# Patient Record
Sex: Male | Born: 1980
Health system: Southern US, Community
[De-identification: ages and names within clinical notes are randomized; demographics above are authoritative.]

## PROBLEM LIST (undated history)

## (undated) DIAGNOSIS — I499 Cardiac arrhythmia, unspecified: Secondary | ICD-10-CM

## (undated) DIAGNOSIS — Z9089 Acquired absence of other organs: Secondary | ICD-10-CM

## (undated) DIAGNOSIS — I2699 Other pulmonary embolism without acute cor pulmonale: Secondary | ICD-10-CM

## (undated) DIAGNOSIS — S82899A Other fracture of unspecified lower leg, initial encounter for closed fracture: Secondary | ICD-10-CM

## (undated) HISTORY — DX: Other pulmonary embolism without acute cor pulmonale: I26.99

## (undated) HISTORY — PX: SHOULDER SURGERY: SHX246

## (undated) HISTORY — DX: Acquired absence of other organs: Z90.89

## (undated) HISTORY — DX: Cardiac arrhythmia, unspecified: I49.9

## (undated) HISTORY — DX: Other fracture of unspecified lower leg, initial encounter for closed fracture: S82.899A

---

## 2008-05-08 DIAGNOSIS — J069 Acute upper respiratory infection, unspecified: Secondary | ICD-10-CM | POA: Insufficient documentation

## 2008-05-11 ENCOUNTER — Ambulatory Visit: Payer: Self-pay | Admitting: Family Medicine

## 2008-05-11 DIAGNOSIS — N6019 Diffuse cystic mastopathy of unspecified breast: Secondary | ICD-10-CM | POA: Insufficient documentation

## 2008-05-11 DIAGNOSIS — M25519 Pain in unspecified shoulder: Secondary | ICD-10-CM | POA: Insufficient documentation

## 2009-11-08 ENCOUNTER — Ambulatory Visit: Payer: Self-pay | Admitting: Family Medicine
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
## 2011-05-14 ENCOUNTER — Telehealth: Payer: Self-pay | Admitting: Family Medicine

## 2011-05-14 NOTE — Telephone Encounter (Signed)
Okay per dr Tawanna Cooler

## 2011-05-14 NOTE — Telephone Encounter (Signed)
Pt would like to have chickenpox titer to see if he's immune.

## 2011-05-15 NOTE — Telephone Encounter (Signed)
lmom for pt to call back

## 2011-05-15 NOTE — Telephone Encounter (Signed)
Pt must check sch and callback

## 2011-06-11 ENCOUNTER — Ambulatory Visit (INDEPENDENT_AMBULATORY_CARE_PROVIDER_SITE_OTHER): Payer: 59 | Admitting: Family Medicine

## 2011-06-11 ENCOUNTER — Encounter: Payer: Self-pay | Admitting: Family Medicine

## 2011-06-11 VITALS — BP 120/80 | Temp 97.8°F | Ht 73.0 in | Wt 127.0 lb

## 2011-06-11 DIAGNOSIS — Z299 Encounter for prophylactic measures, unspecified: Secondary | ICD-10-CM

## 2011-06-11 DIAGNOSIS — Z23 Encounter for immunization: Secondary | ICD-10-CM

## 2011-06-12 NOTE — Patient Instructions (Signed)
Tylenol as needed for fever.  Clear liquids until diarrhea completely stops.  Return p.r.n.

## 2011-06-12 NOTE — Progress Notes (Signed)
  Subjective:    Patient ID: William Baldwin, male    DOB: 1981/02/24, 30 y.o.   MRN: [id number]  HPI  Taj  Is a 30 year old male, who comes in for evaluation of abdominal cramps and diarrhea for 3 days.  The first two days.  He had fever and chills, fever, now gone.  No vomiting.  Review of systems otherwise negative  No foreign travel    Review of Systems General and GI review of systems otherwise negative    Objective:   Physical Exam  Well-developed well-nourished, male in no acute distress.  Examination of the abdomen shows the abdomen is soft.  The bowel sounds are normal.  There is no tenderness.  No masses.  No rebound      Assessment & Plan:  Viral gastroenteritis.  Plan treat symptomatically with Tylenol liquids

## 2014-11-09 ENCOUNTER — Emergency Department (HOSPITAL_COMMUNITY): Payer: 59

## 2014-11-09 ENCOUNTER — Emergency Department (HOSPITAL_COMMUNITY): Admission: EM | Admit: 2014-11-09 | Discharge: 2014-11-09 | Discharge status: Absent without leave | Payer: Self-pay

## 2014-11-09 ENCOUNTER — Emergency Department (HOSPITAL_COMMUNITY)
Admission: EM | Admit: 2014-11-09 | Discharge: 2014-11-10 | Disposition: A | Discharge status: Home / Self Care | Payer: 59 | Attending: Emergency Medicine | Admitting: Emergency Medicine

## 2014-11-09 ENCOUNTER — Encounter (HOSPITAL_COMMUNITY): Payer: Self-pay | Admitting: *Deleted

## 2014-11-09 DIAGNOSIS — N200 Calculus of kidney: Secondary | ICD-10-CM

## 2014-11-09 DIAGNOSIS — R109 Unspecified abdominal pain: Secondary | ICD-10-CM

## 2014-11-09 DIAGNOSIS — Z8781 Personal history of (healed) traumatic fracture: Secondary | ICD-10-CM | POA: Diagnosis not present

## 2014-11-09 LAB — URINALYSIS, ROUTINE W REFLEX MICROSCOPIC
BILIRUBIN URINE: NEGATIVE
Glucose, UA: NEGATIVE mg/dL
Ketones, ur: 15 mg/dL — AB
Leukocytes, UA: NEGATIVE
NITRITE: NEGATIVE
PROTEIN: NEGATIVE mg/dL
SPECIFIC GRAVITY, URINE: 1.021 (ref 1.005–1.030)
Urobilinogen, UA: 1 mg/dL (ref 0.0–1.0)
pH: 7.5 (ref 5.0–8.0)

## 2014-11-09 LAB — URINE MICROSCOPIC-ADD ON

## 2014-11-09 LAB — BASIC METABOLIC PANEL
ANION GAP: 9 (ref 5–15)
BUN: 15 mg/dL (ref 6–23)
CO2: 24 mmol/L (ref 19–32)
CREATININE: 1.02 mg/dL (ref 0.50–1.35)
Calcium: 9.1 mg/dL (ref 8.4–10.5)
Chloride: 106 mmol/L (ref 96–112)
Glucose, Bld: 104 mg/dL — ABNORMAL HIGH (ref 70–99)
POTASSIUM: 3.1 mmol/L — AB (ref 3.5–5.1)
Sodium: 139 mmol/L (ref 135–145)

## 2014-11-09 LAB — CBC
HEMATOCRIT: 43.2 % (ref 39.0–52.0)
Hemoglobin: 14.6 g/dL (ref 13.0–17.0)
MCH: 29.3 pg (ref 26.0–34.0)
MCHC: 33.8 g/dL (ref 30.0–36.0)
MCV: 86.7 fL (ref 78.0–100.0)
PLATELETS: 184 10*3/uL (ref 150–400)
RBC: 4.98 MIL/uL (ref 4.22–5.81)
RDW: 13.2 % (ref 11.5–15.5)
WBC: 8.7 10*3/uL (ref 4.0–10.5)

## 2014-11-09 MED ORDER — KETOROLAC TROMETHAMINE 30 MG/ML IJ SOLN
30.0000 mg | Freq: Once | INTRAMUSCULAR | Status: AC
  Administered 2014-11-09: 30 mg via INTRAVENOUS
  Filled 2014-11-09: qty 1

## 2014-11-09 MED ORDER — FENTANYL CITRATE 0.05 MG/ML IJ SOLN
50.0000 ug | Freq: Once | INTRAMUSCULAR | Status: AC
  Administered 2014-11-09: 50 ug via NASAL
  Filled 2014-11-09: qty 2

## 2014-11-09 MED ORDER — HYDROMORPHONE HCL 1 MG/ML IJ SOLN
1.0000 mg | Freq: Once | INTRAMUSCULAR | Status: AC
  Administered 2014-11-09: 1 mg via INTRAVENOUS
  Filled 2014-11-09: qty 1

## 2014-11-09 NOTE — ED Notes (Addendum)
Pt reports severe R flank pain which started today.  Pt reports the severity of the pain comes in waves.  Pt also c/o nausea

## 2014-11-09 NOTE — ED Notes (Signed)
Bed: WA06 Expected date:  Expected time:  Means of arrival:  Comments: No monitor, no bed

## 2014-11-10 ENCOUNTER — Encounter (HOSPITAL_COMMUNITY): Payer: Self-pay | Admitting: *Deleted

## 2014-11-10 MED ORDER — ONDANSETRON 4 MG PO TBDP: ORAL_TABLET | ORAL | Status: DC

## 2014-11-10 MED ORDER — HYDROCODONE-ACETAMINOPHEN 5-325 MG PO TABS: 1.0000 | ORAL_TABLET | Freq: Four times a day (QID) | ORAL | Status: DC | PRN

## 2014-11-10 MED ORDER — HYDROMORPHONE HCL 1 MG/ML IJ SOLN
1.0000 mg | Freq: Once | INTRAMUSCULAR | Status: AC
  Administered 2014-11-10: 1 mg via INTRAVENOUS
  Filled 2014-11-10: qty 1
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
## 2014-11-10 NOTE — Discharge Instructions (Signed)

## 2017-11-06 ENCOUNTER — Ambulatory Visit (HOSPITAL_COMMUNITY): Payer: 59

## 2018-04-08 DIAGNOSIS — M25572 Pain in left ankle and joints of left foot: Secondary | ICD-10-CM | POA: Diagnosis not present

## 2018-04-08 DIAGNOSIS — M25521 Pain in right elbow: Secondary | ICD-10-CM | POA: Diagnosis not present

## 2018-04-13 DIAGNOSIS — M6281 Muscle weakness (generalized): Secondary | ICD-10-CM | POA: Diagnosis not present

## 2018-04-13 DIAGNOSIS — M7711 Lateral epicondylitis, right elbow: Secondary | ICD-10-CM | POA: Diagnosis not present

## 2018-04-13 DIAGNOSIS — M25521 Pain in right elbow: Secondary | ICD-10-CM | POA: Diagnosis not present

## 2018-04-14 DIAGNOSIS — M6281 Muscle weakness (generalized): Secondary | ICD-10-CM | POA: Diagnosis not present

## 2018-04-14 DIAGNOSIS — M7711 Lateral epicondylitis, right elbow: Secondary | ICD-10-CM | POA: Diagnosis not present

## 2018-04-14 DIAGNOSIS — M25521 Pain in right elbow: Secondary | ICD-10-CM | POA: Diagnosis not present

## 2018-04-15 DIAGNOSIS — M7711 Lateral epicondylitis, right elbow: Secondary | ICD-10-CM | POA: Diagnosis not present

## 2018-04-15 DIAGNOSIS — M6281 Muscle weakness (generalized): Secondary | ICD-10-CM | POA: Diagnosis not present

## 2018-04-15 DIAGNOSIS — M25521 Pain in right elbow: Secondary | ICD-10-CM | POA: Diagnosis not present

## 2018-04-19 ENCOUNTER — Encounter: Payer: Self-pay | Admitting: Sports Medicine

## 2018-04-27 ENCOUNTER — Encounter: Payer: Self-pay | Admitting: Sports Medicine

## 2018-04-27 ENCOUNTER — Ambulatory Visit (INDEPENDENT_AMBULATORY_CARE_PROVIDER_SITE_OTHER): Payer: 59 | Admitting: Sports Medicine

## 2018-04-27 VITALS — BP 98/60 | Ht 72.0 in | Wt 179.0 lb

## 2018-04-27 DIAGNOSIS — M2022 Hallux rigidus, left foot: Secondary | ICD-10-CM | POA: Diagnosis not present

## 2018-04-27 DIAGNOSIS — M25521 Pain in right elbow: Secondary | ICD-10-CM | POA: Diagnosis not present

## 2018-04-27 DIAGNOSIS — M7711 Lateral epicondylitis, right elbow: Secondary | ICD-10-CM | POA: Diagnosis not present

## 2018-04-27 DIAGNOSIS — M6281 Muscle weakness (generalized): Secondary | ICD-10-CM | POA: Diagnosis not present

## 2018-04-28 ENCOUNTER — Encounter: Payer: Self-pay | Admitting: Sports Medicine
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
## 2018-05-25 DIAGNOSIS — L812 Freckles: Secondary | ICD-10-CM | POA: Diagnosis not present

## 2018-05-25 DIAGNOSIS — D2261 Melanocytic nevi of right upper limb, including shoulder: Secondary | ICD-10-CM | POA: Diagnosis not present

## 2018-05-25 DIAGNOSIS — C44519 Basal cell carcinoma of skin of other part of trunk: Secondary | ICD-10-CM | POA: Diagnosis not present

## 2018-05-25 DIAGNOSIS — D225 Melanocytic nevi of trunk: Secondary | ICD-10-CM | POA: Diagnosis not present

## 2018-06-08 DIAGNOSIS — M25521 Pain in right elbow: Secondary | ICD-10-CM | POA: Diagnosis not present

## 2018-06-17 DIAGNOSIS — M25521 Pain in right elbow: Secondary | ICD-10-CM | POA: Diagnosis not present

## 2018-07-08 DIAGNOSIS — G5631 Lesion of radial nerve, right upper limb: Secondary | ICD-10-CM | POA: Diagnosis not present

## 2018-07-08 DIAGNOSIS — M7711 Lateral epicondylitis, right elbow: Secondary | ICD-10-CM | POA: Diagnosis not present

## 2018-10-06 ENCOUNTER — Ambulatory Visit: Payer: Self-pay | Admitting: Family Medicine

## 2018-10-11 ENCOUNTER — Encounter: Payer: Self-pay | Admitting: Family Medicine

## 2018-10-11 ENCOUNTER — Ambulatory Visit (INDEPENDENT_AMBULATORY_CARE_PROVIDER_SITE_OTHER): Payer: 59 | Admitting: Family Medicine

## 2018-10-11 DIAGNOSIS — J069 Acute upper respiratory infection, unspecified: Secondary | ICD-10-CM

## 2018-10-11 NOTE — Patient Instructions (Signed)
-  Great to meet you -Continue supportive treatment for sinus congestion.  Let me know if symptoms worsen.
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
## 2018-10-11 NOTE — Assessment & Plan Note (Signed)
  Symptomatic therapy suggested: push fluids, rest, use vaporizer or mist prn, apply heat to sinuses prn and return office visit prn if symptoms persist or worsen. Lack of antibiotic effectiveness discussed with him. Call or return to clinic prn if these symptoms worsen or fail to improve as anticipated.

## 2019-02-14 ENCOUNTER — Ambulatory Visit: Payer: 59 | Admitting: Family Medicine

## 2019-06-28 ENCOUNTER — Other Ambulatory Visit: Payer: Self-pay

## 2019-06-28 DIAGNOSIS — Z20822 Contact with and (suspected) exposure to covid-19: Secondary | ICD-10-CM

## 2019-06-30 LAB — NOVEL CORONAVIRUS, NAA: SARS-CoV-2, NAA: NOT DETECTED

## 2019-09-19 ENCOUNTER — Ambulatory Visit (INDEPENDENT_AMBULATORY_CARE_PROVIDER_SITE_OTHER): Payer: 59 | Admitting: Family Medicine

## 2019-09-19 ENCOUNTER — Encounter: Payer: Self-pay | Admitting: Family Medicine

## 2019-09-19 ENCOUNTER — Ambulatory Visit (INDEPENDENT_AMBULATORY_CARE_PROVIDER_SITE_OTHER): Payer: 59

## 2019-09-19 ENCOUNTER — Other Ambulatory Visit: Payer: Self-pay | Admitting: Family Medicine

## 2019-09-19 ENCOUNTER — Other Ambulatory Visit: Payer: 59

## 2019-09-19 ENCOUNTER — Other Ambulatory Visit: Payer: Self-pay

## 2019-09-19 VITALS — Temp 98.9°F | Ht 72.0 in

## 2019-09-19 DIAGNOSIS — M25532 Pain in left wrist: Secondary | ICD-10-CM

## 2019-09-19 DIAGNOSIS — S62102A Fracture of unspecified carpal bone, left wrist, initial encounter for closed fracture: Secondary | ICD-10-CM
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
## 2019-09-19 NOTE — Assessment & Plan Note (Signed)
Fracture vs Sprain.  Xrays ordered, he will stop by later today to have this completed.   Continue icing and aleve as needed.

## 2019-09-20 ENCOUNTER — Other Ambulatory Visit: Payer: Self-pay

## 2019-09-20 ENCOUNTER — Ambulatory Visit (INDEPENDENT_AMBULATORY_CARE_PROVIDER_SITE_OTHER): Payer: 59 | Admitting: Orthopaedic Surgery

## 2019-09-20 DIAGNOSIS — S62112A Displaced fracture of triquetrum [cuneiform] bone, left wrist, initial encounter for closed fracture: Secondary | ICD-10-CM | POA: Diagnosis not present

## 2019-09-20 NOTE — Telephone Encounter (Signed)
William Baldwin has forwarded to [employer]
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
## 2019-11-14 ENCOUNTER — Ambulatory Visit: Payer: 59 | Attending: Internal Medicine

## 2019-11-14 DIAGNOSIS — Z20822 Contact with and (suspected) exposure to covid-19: Secondary | ICD-10-CM

## 2019-11-15 LAB — NOVEL CORONAVIRUS, NAA: SARS-CoV-2, NAA: NOT DETECTED

## 2019-11-15 LAB — SARS-COV-2, NAA 2 DAY TAT

## 2020-05-30 ENCOUNTER — Encounter: Payer: Self-pay | Admitting: Family Medicine

## 2020-07-23 ENCOUNTER — Encounter: Payer: Self-pay | Admitting: Family Medicine

## 2020-07-23 NOTE — Telephone Encounter (Signed)
Is he needing a referral?  I can enter this if needed, but we should really set him up for an appointment to establish with me at this location.

## 2020-07-24 ENCOUNTER — Encounter: Payer: Self-pay | Admitting: Family Medicine

## 2020-07-25 ENCOUNTER — Telehealth (INDEPENDENT_AMBULATORY_CARE_PROVIDER_SITE_OTHER): Payer: 59 | Admitting: Family Medicine

## 2020-07-25 ENCOUNTER — Encounter: Payer: Self-pay | Admitting: Family Medicine

## 2020-07-25 VITALS — HR 88 | Temp 98.7°F

## 2020-07-25 DIAGNOSIS — R0602 Shortness of breath: Secondary | ICD-10-CM

## 2020-07-25 DIAGNOSIS — U071 COVID-19: Secondary | ICD-10-CM

## 2020-07-25 DIAGNOSIS — I480 Paroxysmal atrial fibrillation: Secondary | ICD-10-CM

## 2020-07-25 MED ORDER — FLOVENT HFA 110 MCG/ACT IN AERO: 2.0000 | INHALATION_SPRAY | Freq: Two times a day (BID) | RESPIRATORY_TRACT | 1 refills | Status: DC

## 2020-07-25 MED ORDER — ALBUTEROL SULFATE HFA 108 (90 BASE) MCG/ACT IN AERS: 2.0000 | INHALATION_SPRAY | Freq: Four times a day (QID) | RESPIRATORY_TRACT | 1 refills | Status: DC | PRN
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
## 2020-07-27 ENCOUNTER — Other Ambulatory Visit: Payer: Self-pay | Admitting: *Deleted

## 2020-07-27 ENCOUNTER — Encounter: Payer: Self-pay | Admitting: Cardiovascular Disease

## 2020-07-27 ENCOUNTER — Other Ambulatory Visit: Payer: Self-pay

## 2020-07-27 ENCOUNTER — Ambulatory Visit (INDEPENDENT_AMBULATORY_CARE_PROVIDER_SITE_OTHER): Payer: 59 | Admitting: Cardiovascular Disease

## 2020-07-27 VITALS — BP 108/68 | HR 115 | Ht 72.0 in | Wt 176.0 lb

## 2020-07-27 DIAGNOSIS — I4819 Other persistent atrial fibrillation: Secondary | ICD-10-CM

## 2020-07-27 MED ORDER — APIXABAN 5 MG PO TABS: 5.0000 mg | ORAL_TABLET | Freq: Two times a day (BID) | ORAL | 0 refills | Status: DC

## 2020-07-27 MED ORDER — METOPROLOL SUCCINATE ER 25 MG PO TB24: 25.0000 mg | ORAL_TABLET | Freq: Every day | ORAL | 11 refills | Status: DC

## 2020-07-27 NOTE — Patient Instructions (Signed)
Medication Instructions:  Your physician has recommended you make the following change in your medication:  1.  Start Eliquis one tablet by mouth ( 5 mg ) twice daily.  2.  Start Metoprolol one tablet by mouth ( 25 mg ) daily.  3.  # 30 has been sent of both medications to requested pharmacy.   *If you need a refill on your cardiac medications before your next appointment, please call your pharmacy*   Lab Work: Your physician recommends that you have lab work today: CBC/BMET/TSH  If you have labs (blood work) drawn today and your tests are completely normal, you will receive your results only by: Marland Kitchen MyChart Message (if you have MyChart) OR . A paper copy in the mail If you have any lab test that is abnormal or we need to change your treatment, we will call you to review the results.   Testing/Procedures: Dr. Audie Box will talk to you about echo when you come back in 6 weeks.   Echocardiogram An echocardiogram is a procedure that uses painless sound waves (ultrasound) to produce an image of the heart. Images from an echocardiogram can provide important information about:  Signs of coronary artery disease (CAD).  Aneurysm detection. An aneurysm is a weak or damaged part of an artery wall that bulges out from the normal force of blood pumping through the body.  Heart size and shape. Changes in the size or shape of the heart can be associated with certain conditions, including heart failure, aneurysm, and CAD.  Heart muscle function.  Heart valve function.  Signs of a past heart attack.  Fluid buildup around the heart.  Thickening of the heart muscle.  A tumor or infectious growth around the heart valves. Tell a health care provider about:  Any allergies you have.  All medicines you are taking, including vitamins, herbs, eye drops, creams, and over-the-counter medicines.  Any blood disorders you have.  Any surgeries you have had.  Any medical conditions you  have.  Whether you are pregnant or may be pregnant. What are the risks? Generally, this is a safe procedure. However, problems may occur, including:  Allergic reaction to dye (contrast) that may be used during the procedure. What happens before the procedure? No specific preparation is needed. You may eat and drink normally. What happens during the procedure?   An IV tube may be inserted into one of your veins.  You may receive contrast through this tube. A contrast is an injection that improves the quality of the pictures from your heart.  A gel will be applied to your chest.  A wand-like tool (transducer) will be moved over your chest. The gel will help to transmit the sound waves from the transducer.  The sound waves will harmlessly bounce off of your heart to allow the heart images to be captured in real-time motion. The images will be recorded on a computer. The procedure may vary among health care providers and hospitals. What happens after the procedure?  You may return to your normal, everyday life, including diet, activities, and medicines, unless your health care provider tells you not to do that. Summary  An echocardiogram is a procedure that uses painless sound waves (ultrasound) to produce an image of the heart.  Images from an echocardiogram can provide important information about the size and shape of your heart, heart muscle function, heart valve function, and fluid buildup around your heart.  You do not need to do anything to prepare before this  procedure. You may eat and drink normally.  After the echocardiogram is completed, you may return to your normal, everyday life, unless your health care provider tells you not to do that. This information is not intended to replace advice given to you by your health care provider. Make sure you discuss any questions you have with your health care provider. Document Revised: 12/02/2018 Document Reviewed: 09/13/2016 Elsevier  Patient Education  [place]: Your physician recommends that you schedule a follow-up appointment on Wednesday, January 12 @ 9:20 am.     Other Instructions Discuss with your spouse about TEE/Cardioversion

## 2020-07-28 ENCOUNTER — Encounter (HOSPITAL_BASED_OUTPATIENT_CLINIC_OR_DEPARTMENT_OTHER): Payer: Self-pay | Admitting: *Deleted

## 2020-07-28 ENCOUNTER — Emergency Department (HOSPITAL_BASED_OUTPATIENT_CLINIC_OR_DEPARTMENT_OTHER): Payer: 59

## 2020-07-28 ENCOUNTER — Inpatient Hospital Stay (HOSPITAL_BASED_OUTPATIENT_CLINIC_OR_DEPARTMENT_OTHER)
Admission: EM | Admit: 2020-07-28 | Discharge: 2020-07-30 | DRG: 175 | Disposition: A | Discharge status: Home / Self Care | Payer: 59 | Attending: Internal Medicine | Admitting: Internal Medicine

## 2020-07-28 DIAGNOSIS — I480 Paroxysmal atrial fibrillation: Secondary | ICD-10-CM

## 2020-07-28 DIAGNOSIS — I4819 Other persistent atrial fibrillation: Secondary | ICD-10-CM | POA: Diagnosis present

## 2020-07-28 DIAGNOSIS — R079 Chest pain, unspecified: Secondary | ICD-10-CM

## 2020-07-28 DIAGNOSIS — I2699 Other pulmonary embolism without acute cor pulmonale: Secondary | ICD-10-CM | POA: Diagnosis present

## 2020-07-28 DIAGNOSIS — U099 Post covid-19 condition, unspecified: Secondary | ICD-10-CM | POA: Diagnosis present

## 2020-07-28 DIAGNOSIS — I2693 Single subsegmental pulmonary embolism without acute cor pulmonale: Principal | ICD-10-CM | POA: Diagnosis present

## 2020-07-28 DIAGNOSIS — R000 Tachycardia, unspecified: Secondary | ICD-10-CM | POA: Diagnosis present

## 2020-07-28 DIAGNOSIS — U071 COVID-19: Secondary | ICD-10-CM

## 2020-07-28 DIAGNOSIS — I4891 Unspecified atrial fibrillation: Secondary | ICD-10-CM

## 2020-07-28 DIAGNOSIS — R0902 Hypoxemia: Secondary | ICD-10-CM | POA: Diagnosis present

## 2020-07-28 LAB — BASIC METABOLIC PANEL
Anion gap: 13 (ref 5–15)
BUN/Creatinine Ratio: 14 (ref 9–20)
BUN: 15 mg/dL (ref 6–24)
BUN: 13 mg/dL (ref 6–20)
CO2: 25 mmol/L (ref 20–29)
CO2: 25 mmol/L (ref 22–32)
Calcium: 8.9 mg/dL (ref 8.7–10.2)
Calcium: 9.1 mg/dL (ref 8.9–10.3)
Chloride: 100 mmol/L (ref 96–106)
Chloride: 101 mmol/L (ref 98–111)
Creatinine, Ser: 1.04 mg/dL (ref 0.76–1.27)
Creatinine, Ser: 0.98 mg/dL (ref 0.61–1.24)
GFR calc Af Amer: 103 mL/min/{1.73_m2} (ref >59)
GFR calc non Af Amer: 89 mL/min/{1.73_m2} (ref >59)
GFR, Estimated: >60 mL/min (ref >60)
Glucose, Bld: 100 mg/dL — ABNORMAL HIGH (ref 70–99)
Glucose: 100 mg/dL — ABNORMAL HIGH (ref 65–99)
Potassium: 4.6 mmol/L (ref 3.5–5.2)
Potassium: 3.9 mmol/L (ref 3.5–5.1)
Sodium: 140 mmol/L (ref 134–144)
Sodium: 139 mmol/L (ref 135–145)

## 2020-07-28 LAB — CBC
HCT: 44.6 % (ref 39.0–52.0)
Hematocrit: 43.9 % (ref 37.5–51.0)
Hemoglobin: 14.5 g/dL (ref 13.0–17.7)
Hemoglobin: 14.8 g/dL (ref 13.0–17.0)
MCH: 29.1 pg (ref 26.6–33.0)
MCH: 29.2 pg (ref 26.0–34.0)
MCHC: 33 g/dL (ref 31.5–35.7)
MCHC: 33.2 g/dL (ref 30.0–36.0)
MCV: 88 fL (ref 79–97)
MCV: 88.1 fL (ref 80.0–100.0)
Platelets: 325 10*3/uL (ref 150–450)
Platelets: 309 10*3/uL (ref 150–400)
RBC: 4.99 x10E6/uL (ref 4.14–5.80)
RBC: 5.06 MIL/uL (ref 4.22–5.81)
RDW: 12.3 % (ref 11.6–15.4)
RDW: 12.4 % (ref 11.5–15.5)
WBC: 10.1 10*3/uL (ref 3.4–10.8)
WBC: 14.2 10*3/uL — ABNORMAL HIGH (ref 4.0–10.5)
nRBC: 0 % (ref 0.0–0.2)

## 2020-07-28 LAB — APTT
aPTT: >200 seconds (ref 24–36)
aPTT: 81 seconds — ABNORMAL HIGH (ref 24–36)

## 2020-07-28 LAB — RESP PANEL BY RT-PCR (FLU A&B, COVID) ARPGX2
Influenza A by PCR: NEGATIVE
Influenza B by PCR: NEGATIVE
SARS Coronavirus 2 by RT PCR: POSITIVE — AB

## 2020-07-28 LAB — PROTIME-INR
INR: 1.2 (ref 0.8–1.2)
Prothrombin Time: 14.3 seconds (ref 11.4–15.2)

## 2020-07-28 LAB — TROPONIN I (HIGH SENSITIVITY): Troponin I (High Sensitivity): <2 ng/L (ref <18)

## 2020-07-28 LAB — TSH: TSH: 0.861 u[IU]/mL (ref 0.450–4.500)

## 2020-07-28 MED ORDER — IOHEXOL 350 MG/ML SOLN
100.0000 mL | Freq: Once | INTRAVENOUS | Status: AC | PRN
  Administered 2020-07-28: 100 mL via INTRAVENOUS

## 2020-07-28 MED ORDER — SODIUM CHLORIDE 0.9 % IV BOLUS
1000.0000 mL | Freq: Once | INTRAVENOUS | Status: AC
  Administered 2020-07-28: 1000 mL via INTRAVENOUS

## 2020-07-28 MED ORDER — HYDROMORPHONE HCL 1 MG/ML IJ SOLN
1.0000 mg | Freq: Once | INTRAMUSCULAR | Status: AC
  Administered 2020-07-28: 1 mg via INTRAVENOUS
  Filled 2020-07-28: qty 1

## 2020-07-28 MED ORDER — HEPARIN BOLUS VIA INFUSION
3000.0000 [IU] | Freq: Once | INTRAVENOUS | Status: AC
  Administered 2020-07-28: 3000 [IU] via INTRAVENOUS

## 2020-07-28 MED ORDER — SODIUM CHLORIDE 0.9 % IV SOLN
2.0000 g | Freq: Once | INTRAVENOUS | Status: AC
  Administered 2020-07-28: 2 g via INTRAVENOUS
  Filled 2020-07-28: qty 20

## 2020-07-28 MED ORDER — FENTANYL CITRATE (PF) 100 MCG/2ML IJ SOLN
100.0000 ug | Freq: Once | INTRAMUSCULAR | Status: AC
  Administered 2020-07-28: 100 ug via INTRAVENOUS
  Filled 2020-07-28: qty 2

## 2020-07-28 MED ORDER — SODIUM CHLORIDE 0.9 % IV SOLN
500.0000 mg | Freq: Once | INTRAVENOUS | Status: AC
  Administered 2020-07-28: 500 mg via INTRAVENOUS
  Filled 2020-07-28: qty 500

## 2020-07-28 MED ORDER — KETOROLAC TROMETHAMINE 30 MG/ML IJ SOLN
30.0000 mg | Freq: Once | INTRAMUSCULAR | Status: AC
  Administered 2020-07-28: 30 mg via INTRAVENOUS
  Filled 2020-07-28: qty 1

## 2020-07-28 MED ORDER — HEPARIN (PORCINE) 25000 UT/250ML-% IV SOLN
1400.0000 [IU]/h | INTRAVENOUS | Status: DC
  Administered 2020-07-28 – 2020-07-29 (×2): 1400 [IU]/h via INTRAVENOUS
  Filled 2020-07-28 (×3): qty 250

## 2020-07-28 MED ORDER — SODIUM CHLORIDE 0.9 % IV SOLN: INTRAVENOUS | Status: DC | PRN

## 2020-07-28 MED ORDER — HYDROMORPHONE HCL 1 MG/ML IJ SOLN
1.0000 mg | INTRAMUSCULAR | Status: DC | PRN
  Administered 2020-07-28 – 2020-07-29 (×3): 1 mg via INTRAVENOUS
  Filled 2020-07-28 (×3): qty 1
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
## 2020-07-28 NOTE — ED Notes (Signed)
ALLERGY STATUS CONFIRMED SM SIPS OF WATER WITH AM MEDS APPROX 1 HOUR AGO

## 2020-07-28 NOTE — ED Notes (Signed)
ED Provider at bedside. 

## 2020-07-28 NOTE — ED Triage Notes (Signed)
Presents with chest pain, onset yesterday, recently had Covid19, pulse when palpated feels irregular, having SOB and fatigue
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
## 2020-07-28 NOTE — ED Notes (Signed)
Consulted dr Gilford Raid regarding abnormal aptt - plan to redraw
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
## 2020-07-28 NOTE — ED Triage Notes (Signed)
Placed in exam room 9 and immediately placed on cont cardiac monitoring with 12 lead ECG obtained

## 2020-07-28 NOTE — Plan of Care (Signed)
[facility] Bed 9 Plan of care, til formal admission. This is to relay information Patient is a 39 year old male hospitalized with Covid 1 month ago who presents emergency department with left-sided chest pain.  He reports that he began having gradual onset of left-sided chest pain from his left shoulder down to his abdomen beginning yesterday.  It is pleuritic in nature, never happened before.  While he was hospitalized he was diagnosed with new onset atrial fibrillation and saw the cardiologist yesterday and was started on Eliquis, which she is taken 1 dose of, on metoprolol as well story of DVT or PE but has new PE on CT scan.  He is having a fair amount of pain  Was hospitalized at [facility], a few weeks ago. was sent home on room air, satting 90-94%, dx'ed with new onset Afib and cardiovert in about 4 weeks  ED provider talked with caridology, start on heparin and bring in for echo,   Doesn't think he needs to go to [facility].  New onset PE - started on heparin gtt in the ED, likely transition to a DOAC, consult [facility] after talking with Achara. --plan was for echo. --probably gentle with beta blocker to control afib rate to not decompensate but defer to cardiology of course.  Bilateral pneumonia on CT scan after having covid.  Wbc 14. ED provider said he was gonna go ahead and start antibiotics even though clinically borderline  Atrial fibrillation-likely continue Eliquis and metoprolol

## 2020-07-29 ENCOUNTER — Inpatient Hospital Stay (HOSPITAL_COMMUNITY): Payer: 59

## 2020-07-29 DIAGNOSIS — R000 Tachycardia, unspecified: Secondary | ICD-10-CM | POA: Diagnosis present

## 2020-07-29 DIAGNOSIS — U071 COVID-19: Secondary | ICD-10-CM | POA: Diagnosis present

## 2020-07-29 DIAGNOSIS — U099 Post covid-19 condition, unspecified: Secondary | ICD-10-CM | POA: Diagnosis present

## 2020-07-29 DIAGNOSIS — I2699 Other pulmonary embolism without acute cor pulmonale: Secondary | ICD-10-CM

## 2020-07-29 DIAGNOSIS — I2693 Single subsegmental pulmonary embolism without acute cor pulmonale: Principal | ICD-10-CM

## 2020-07-29 DIAGNOSIS — I4819 Other persistent atrial fibrillation: Secondary | ICD-10-CM

## 2020-07-29 DIAGNOSIS — I480 Paroxysmal atrial fibrillation: Secondary | ICD-10-CM | POA: Diagnosis present

## 2020-07-29 DIAGNOSIS — R0902 Hypoxemia: Secondary | ICD-10-CM | POA: Diagnosis present

## 2020-07-29 LAB — HIV ANTIBODY (ROUTINE TESTING W REFLEX): HIV Screen 4th Generation wRfx: NONREACTIVE

## 2020-07-29 LAB — T4, FREE: Free T4: 1.4 ng/dL — ABNORMAL HIGH (ref 0.61–1.12)

## 2020-07-29 LAB — CBC
HCT: 39.9 % (ref 39.0–52.0)
Hemoglobin: 12.9 g/dL — ABNORMAL LOW (ref 13.0–17.0)
MCH: 29.5 pg (ref 26.0–34.0)
MCHC: 32.3 g/dL (ref 30.0–36.0)
MCV: 91.3 fL (ref 80.0–100.0)
Platelets: 268 10*3/uL (ref 150–400)
RBC: 4.37 MIL/uL (ref 4.22–5.81)
RDW: 12.6 % (ref 11.5–15.5)
WBC: 11.9 10*3/uL — ABNORMAL HIGH (ref 4.0–10.5)
nRBC: 0 % (ref 0.0–0.2)

## 2020-07-29 LAB — TSH: TSH: 2.319 u[IU]/mL (ref 0.350–4.500)

## 2020-07-29 LAB — LIPID PANEL
Cholesterol: 141 mg/dL (ref 0–200)
HDL: 39 mg/dL — ABNORMAL LOW (ref >40)
LDL Cholesterol: 80 mg/dL (ref 0–99)
Total CHOL/HDL Ratio: 3.6 RATIO
Triglycerides: 109 mg/dL (ref <150)
VLDL: 22 mg/dL (ref 0–40)

## 2020-07-29 LAB — BRAIN NATRIURETIC PEPTIDE: B Natriuretic Peptide: 76.3 pg/mL (ref 0.0–100.0)

## 2020-07-29 LAB — APTT: aPTT: 65 seconds — ABNORMAL HIGH (ref 24–36)

## 2020-07-29 LAB — HEPARIN LEVEL (UNFRACTIONATED)
Heparin Unfractionated: 0.65 IU/mL (ref 0.30–0.70)
Heparin Unfractionated: 0.56 IU/mL (ref 0.30–0.70)

## 2020-07-29 LAB — TROPONIN I (HIGH SENSITIVITY): Troponin I (High Sensitivity): <2 ng/L (ref <18)

## 2020-07-29 LAB — PROTIME-INR
INR: 1.1 (ref 0.8–1.2)
Prothrombin Time: 14.1 seconds (ref 11.4–15.2)

## 2020-07-29 LAB — PROCALCITONIN: Procalcitonin: 0.11 ng/mL

## 2020-07-29 LAB — MAGNESIUM: Magnesium: 2.3 mg/dL (ref 1.7–2.4)

## 2020-07-29 LAB — C-REACTIVE PROTEIN: CRP: 21.7 mg/dL — ABNORMAL HIGH (ref <1.0)

## 2020-07-29 LAB — ECHOCARDIOGRAM COMPLETE
Area-P 1/2: 2.69 cm2
S' Lateral: 2.7 cm

## 2020-07-29 LAB — SEDIMENTATION RATE: Sed Rate: 97 mm/hr — ABNORMAL HIGH (ref 0–16)

## 2020-07-29 MED ORDER — HYDROMORPHONE HCL 1 MG/ML IJ SOLN
0.4000 mg | INTRAMUSCULAR | Status: DC | PRN
  Administered 2020-07-29 (×2): 0.4 mg via INTRAVENOUS
  Filled 2020-07-29 (×2): qty 0.5

## 2020-07-29 MED ORDER — SODIUM CHLORIDE 0.9 % IV SOLN
500.0000 mg | INTRAVENOUS | Status: DC
  Filled 2020-07-29: qty 500

## 2020-07-29 MED ORDER — LIDOCAINE 5 % EX PTCH
1.0000 | MEDICATED_PATCH | CUTANEOUS | Status: DC
  Administered 2020-07-29: 1 via TRANSDERMAL
  Filled 2020-07-29 (×2): qty 1

## 2020-07-29 MED ORDER — HYDROMORPHONE HCL 1 MG/ML IJ SOLN
0.5000 mg | INTRAMUSCULAR | Status: DC | PRN
Start: 2020-07-29 — End: 2020-07-29

## 2020-07-29 MED ORDER — SODIUM CHLORIDE 0.9 % IV SOLN
1.0000 g | INTRAVENOUS | Status: DC
  Administered 2020-07-29: 1 g via INTRAVENOUS
  Filled 2020-07-29 (×2): qty 10

## 2020-07-29 MED ORDER — METOPROLOL SUCCINATE ER 25 MG PO TB24
12.5000 mg | ORAL_TABLET | Freq: Every day | ORAL | Status: DC
  Administered 2020-07-29 – 2020-07-30 (×2): 12.5 mg via ORAL
  Filled 2020-07-29 (×2): qty 1

## 2020-07-29 MED ORDER — OXYCODONE HCL 5 MG PO TABS
5.0000 mg | ORAL_TABLET | ORAL | Status: DC | PRN
  Administered 2020-07-29 (×2): 5 mg via ORAL
  Filled 2020-07-29 (×5): qty 1

## 2020-07-29 MED ORDER — AZITHROMYCIN 250 MG PO TABS
500.0000 mg | ORAL_TABLET | Freq: Every day | ORAL | Status: DC
  Administered 2020-07-29 – 2020-07-30 (×2): 500 mg via ORAL
  Filled 2020-07-29: qty 2

## 2020-07-29 MED ORDER — DEXAMETHASONE 4 MG PO TABS
6.0000 mg | ORAL_TABLET | ORAL | Status: DC
  Administered 2020-07-29 – 2020-07-30 (×2): 6 mg via ORAL
  Filled 2020-07-29 (×2): qty 1

## 2020-07-29 MED ORDER — PANTOPRAZOLE SODIUM 40 MG PO TBEC
40.0000 mg | DELAYED_RELEASE_TABLET | Freq: Every day | ORAL | Status: DC
  Administered 2020-07-29 – 2020-07-30 (×2): 40 mg via ORAL
  Filled 2020-07-29: qty 1

## 2020-07-29 MED ORDER — ONDANSETRON HCL 4 MG/2ML IJ SOLN: 4.0000 mg | Freq: Four times a day (QID) | INTRAMUSCULAR | Status: DC | PRN

## 2020-07-29 MED ORDER — HYDROMORPHONE HCL 1 MG/ML IJ SOLN
1.0000 mg | INTRAMUSCULAR | Status: DC | PRN
  Administered 2020-07-29 (×5): 1 mg via INTRAVENOUS
  Filled 2020-07-29 (×5): qty 1

## 2020-07-29 MED ORDER — ACETAMINOPHEN 325 MG PO TABS
650.0000 mg | ORAL_TABLET | ORAL | Status: DC | PRN
  Administered 2020-07-29 – 2020-07-30 (×2): 650 mg via ORAL
  Filled 2020-07-29 (×2): qty 2

## 2020-07-29 NOTE — Plan of Care (Signed)
  Problem: Education: Goal: Knowledge of General Education information will improve Description: Including pain rating scale, medication(s)/side effects and non-pharmacologic comfort measures Outcome: Progressing   Problem: Health Behavior/Discharge Planning: Goal: Ability to manage health-related needs will improve Outcome: Progressing   Problem: Clinical Measurements: Goal: Ability to maintain clinical measurements within normal limits will improve Outcome: Progressing Goal: Will remain free from infection Outcome: Progressing Goal: Diagnostic test results will improve Outcome: Progressing Goal: Respiratory complications will improve Outcome: Progressing Goal: Cardiovascular complication will be avoided Outcome: Progressing   Problem: Activity: Goal: Risk for activity intolerance will decrease Outcome: Progressing   Problem: Nutrition: Goal: Adequate nutrition will be maintained Outcome: Progressing   Problem: Coping: Goal: Level of anxiety will decrease Outcome: Progressing   Problem: Elimination: Goal: Will not experience complications related to bowel motility Outcome: Progressing Goal: Will not experience complications related to urinary retention Outcome: Progressing   Problem: Pain Managment: Goal: General experience of comfort will improve Outcome: Progressing   Problem: Safety: Goal: Ability to remain free from injury will improve Outcome: Progressing   Problem: Skin Integrity: Goal: Risk for impaired skin integrity will decrease Outcome: Progressing   Problem: Education: Goal: Knowledge of risk factors and measures for prevention of condition will improve Outcome: Progressing   Problem: Coping: Goal: Psychosocial and spiritual needs will be supported Outcome: Progressing   Problem: Respiratory: Goal: Will maintain a patent airway Outcome: Progressing Goal: Complications related to the disease process, condition or treatment will be avoided or  minimized Outcome: Progressing   Problem: Activity: Goal: Ability to tolerate increased activity will improve Outcome: Progressing   Problem: Cardiac: Goal: Ability to achieve and maintain adequate cardiopulmonary perfusion will improve Outcome: Progressing

## 2020-07-29 NOTE — Progress Notes (Signed)
Assisted cardiology in video chat with patient

## 2020-07-29 NOTE — Plan of Care (Signed)
Problem: Education: Goal: Knowledge of General Education information will improve Description: Including pain rating scale, medication(s)/side effects and non-pharmacologic comfort measures 07/29/2020 2303 by Micheline Chapman, RN Outcome: Progressing 07/29/2020 2250 by Micheline Chapman, RN Outcome: Progressing   Problem: Health Behavior/Discharge Planning: Goal: Ability to manage health-related needs will improve 07/29/2020 2303 by Micheline Chapman, RN Outcome: Progressing 07/29/2020 2250 by Micheline Chapman, RN Outcome: Progressing   Problem: Clinical Measurements: Goal: Ability to maintain clinical measurements within normal limits will improve 07/29/2020 2303 by Micheline Chapman, RN Outcome: Progressing 07/29/2020 2250 by Micheline Chapman, RN Outcome: Progressing Goal: Will remain free from infection 07/29/2020 2303 by Micheline Chapman, RN Outcome: Progressing 07/29/2020 2250 by Micheline Chapman, RN Outcome: Progressing Goal: Diagnostic test results will improve 07/29/2020 2303 by Micheline Chapman, RN Outcome: Progressing 07/29/2020 2250 by Micheline Chapman, RN Outcome: Progressing Goal: Respiratory complications will improve 07/29/2020 2303 by Micheline Chapman, RN Outcome: Progressing 07/29/2020 2250 by Micheline Chapman, RN Outcome: Progressing Goal: Cardiovascular complication will be avoided 07/29/2020 2303 by Micheline Chapman, RN Outcome: Progressing 07/29/2020 2250 by Micheline Chapman, RN Outcome: Progressing   Problem: Activity: Goal: Risk for activity intolerance will decrease 07/29/2020 2303 by Micheline Chapman, RN Outcome: Progressing 07/29/2020 2250 by Micheline Chapman, RN Outcome: Progressing   Problem: Nutrition: Goal: Adequate nutrition will be maintained 07/29/2020 2303 by Micheline Chapman, RN Outcome: Progressing 07/29/2020 2250 by Micheline Chapman, RN Outcome: Progressing   Problem: Coping: Goal: Level of anxiety will decrease 07/29/2020 2303 by Micheline Chapman, RN Outcome:  Progressing 07/29/2020 2250 by Micheline Chapman, RN Outcome: Progressing   Problem: Elimination: Goal: Will not experience complications related to bowel motility 07/29/2020 2303 by Micheline Chapman, RN Outcome: Progressing 07/29/2020 2250 by Micheline Chapman, RN Outcome: Progressing Goal: Will not experience complications related to urinary retention 07/29/2020 2303 by Micheline Chapman, RN Outcome: Progressing 07/29/2020 2250 by Micheline Chapman, RN Outcome: Progressing   Problem: Pain Managment: Goal: General experience of comfort will improve 07/29/2020 2303 by Micheline Chapman, RN Outcome: Progressing 07/29/2020 2250 by Micheline Chapman, RN Outcome: Progressing   Problem: Safety: Goal: Ability to remain free from injury will improve 07/29/2020 2303 by Micheline Chapman, RN Outcome: Progressing 07/29/2020 2250 by Micheline Chapman, RN Outcome: Progressing   Problem: Skin Integrity: Goal: Risk for impaired skin integrity will decrease 07/29/2020 2303 by Micheline Chapman, RN Outcome: Progressing 07/29/2020 2250 by Micheline Chapman, RN Outcome: Progressing   Problem: Education: Goal: Knowledge of risk factors and measures for prevention of condition will improve 07/29/2020 2303 by Micheline Chapman, RN Outcome: Progressing 07/29/2020 2250 by Micheline Chapman, RN Outcome: Progressing   Problem: Coping: Goal: Psychosocial and spiritual needs will be supported 07/29/2020 2303 by Micheline Chapman, RN Outcome: Progressing 07/29/2020 2250 by Micheline Chapman, RN Outcome: Progressing   Problem: Respiratory: Goal: Will maintain a patent airway 07/29/2020 2303 by Micheline Chapman, RN Outcome: Progressing 07/29/2020 2250 by Micheline Chapman, RN Outcome: Progressing Goal: Complications related to the disease process, condition or treatment will be avoided or minimized 07/29/2020 2303 by Micheline Chapman, RN Outcome: Progressing 07/29/2020 2250 by Micheline Chapman, RN Outcome: Progressing   Problem:  Activity: Goal: Ability to tolerate increased activity will improve 07/29/2020 2303 by Micheline Chapman, RN Outcome: Progressing 07/29/2020 2250 by Micheline Chapman, RN Outcome: Progressing   Problem: Cardiac: Goal: Ability to  achieve and maintain adequate cardiopulmonary perfusion will improve 07/29/2020 2303 by Micheline Chapman, RN Outcome: Progressing 07/29/2020 2250 by Micheline Chapman, RN Outcome: Progressing

## 2020-07-29 NOTE — Progress Notes (Signed)
I spoke with Wife and Dr Curt Jews about isolation requirement due to patient being readmitted for covid.  Recommendation is to remain on isolation for 21 due to respiratory symptoms.  Can reevaluate coming off isolation at 21 days.
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
## 2020-07-29 NOTE — Plan of Care (Signed)
Pt stated his pain was not well controlled, him and wife agreed to benefit over risk of increasing  We talked about risk of dependency and they verbally agreed  Tylenol q4h prn Oxycodone prn q4 moderate, titrate this Dilaudid 0.5 for breakthrough Hold if sedated or systolic <07  Hoping that lidocaine patch will be effective and I hope it will
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
## 2020-07-29 NOTE — Progress Notes (Signed)
  Echocardiogram 2D Echocardiogram has been performed.  William Baldwin 07/29/2020, 1:18 PM

## 2020-07-29 NOTE — Progress Notes (Addendum)
ANTICOAGULATION CONSULT NOTE  Pharmacy Consult for Heparin Indication: atrial fibrillation and pulmonary embolus  No Known Allergies  Patient Measurements: Height: 6' (182.9 cm) Weight: 81.2 kg (179 lb 0.2 oz) IBW/kg (Calculated) : 77.6 Heparin Dosing Weight: 80.3 kg  Vital Signs: Temp: 97.9 F (36.6 C) (12/05 0430) Temp Source: Oral (12/05 0430) BP: 116/69 (12/05 0924) Pulse Rate: 91 (12/05 0924)  Labs: Recent Labs    07/27/20 1501 07/28/20 1100 07/28/20 2127 07/28/20 2223 07/29/20 0513 07/29/20 0822 07/29/20 1000  HGB 14.5 14.8  --   --  12.9*  --   --   HCT 43.9 44.6  --   --  39.9  --   --   PLT 325 309  --   --  268  --   --   APTT  --   --  >200* 81* 65*  --   --   LABPROT  --  14.3  --   --   --   --   --   INR  --  1.2  --   --   --   --   --   HEPARINUNFRC  --   --   --   --  0.65  --  0.56  CREATININE 1.04 0.98  --   --   --   --   --   TROPONINIHS  --  <2  --   --   --  <2  --     Estimated Creatinine Clearance: 108.9 mL/min (by C-G formula based on SCr of 0.98 mg/dL).   Assessment: Patient is a 35 yoM that was recently admitted to [facility] for covid PNA and new onset afib. The patient was seen by cardiology yesterday and was started on Apixaban. Patient took his first dose of Apixaban this morning. The patient presents today with new onset of chest pain and was found to have a PE. Pharmacy has been asked to dose heparin at this time for PE.  Baseline CBC WNL.  07/29/2020:  Confirmatory Heparin level 0.56- remains therapeutic on 1400 units/hr  CBC WNL  No bleeding or infusion related issues reported by RN  Goal of Therapy:  Heparin level 0.3-0.7 units/ml  APTT 66-102 secs Monitor platelets by anticoagulation protocol: Yes   Plan:  Cont heparin at 1400 units/hr Daily CBC & HL while on heparin Monitor for bleeding IV azith> PO per protocol F/u for transition to [place], Pharm.D 07/29/2020 12:28 PM

## 2020-07-30 DIAGNOSIS — I2693 Single subsegmental pulmonary embolism without acute cor pulmonale: Secondary | ICD-10-CM | POA: Diagnosis not present

## 2020-07-30 LAB — BASIC METABOLIC PANEL
Anion gap: 10 (ref 5–15)
BUN: 9 mg/dL (ref 6–20)
CO2: 27 mmol/L (ref 22–32)
Calcium: 8.9 mg/dL (ref 8.9–10.3)
Chloride: 103 mmol/L (ref 98–111)
Creatinine, Ser: 0.77 mg/dL (ref 0.61–1.24)
GFR, Estimated: >60 mL/min (ref >60)
Glucose, Bld: 147 mg/dL — ABNORMAL HIGH (ref 70–99)
Potassium: 4.4 mmol/L (ref 3.5–5.1)
Sodium: 140 mmol/L (ref 135–145)

## 2020-07-30 LAB — CBC
HCT: 36.4 % — ABNORMAL LOW (ref 39.0–52.0)
Hemoglobin: 11.9 g/dL — ABNORMAL LOW (ref 13.0–17.0)
MCH: 29.6 pg (ref 26.0–34.0)
MCHC: 32.7 g/dL (ref 30.0–36.0)
MCV: 90.5 fL (ref 80.0–100.0)
Platelets: 260 10*3/uL (ref 150–400)
RBC: 4.02 MIL/uL — ABNORMAL LOW (ref 4.22–5.81)
RDW: 12.4 % (ref 11.5–15.5)
WBC: 9 10*3/uL (ref 4.0–10.5)
nRBC: 0 % (ref 0.0–0.2)

## 2020-07-30 LAB — HEPARIN LEVEL (UNFRACTIONATED): Heparin Unfractionated: 0.39 IU/mL (ref 0.30–0.70)

## 2020-07-30 LAB — T3: T3, Total: 100 ng/dL (ref 71–180)

## 2020-07-30 MED ORDER — PANTOPRAZOLE SODIUM 40 MG PO TBEC
40.0000 mg | DELAYED_RELEASE_TABLET | Freq: Every day | ORAL | 0 refills | Status: DC
Start: 2020-07-31 — End: 2020-08-08

## 2020-07-30 MED ORDER — LACTATED RINGERS IV SOLN: INTRAVENOUS | Status: DC

## 2020-07-30 MED ORDER — LACTATED RINGERS IV BOLUS
1000.0000 mL | Freq: Once | INTRAVENOUS | Status: AC
  Administered 2020-07-30: 1000 mL via INTRAVENOUS

## 2020-07-30 MED ORDER — DEXAMETHASONE 6 MG PO TABS: 6.0000 mg | ORAL_TABLET | Freq: Every day | ORAL | 0 refills | Status: AC

## 2020-07-30 MED ORDER — APIXABAN 5 MG PO TABS
10.0000 mg | ORAL_TABLET | Freq: Two times a day (BID) | ORAL | Status: DC
  Administered 2020-07-30: 10 mg via ORAL
  Filled 2020-07-30: qty 2

## 2020-07-30 MED ORDER — APIXABAN 5 MG PO TABS: 5.0000 mg | ORAL_TABLET | Freq: Two times a day (BID) | ORAL | Status: DC

## 2020-07-30 MED ORDER — METOPROLOL TARTRATE 5 MG/5ML IV SOLN: 5.0000 mg | Freq: Once | INTRAVENOUS | Status: DC

## 2020-07-30 MED ORDER — APIXABAN (ELIQUIS) VTE STARTER PACK (10MG AND 5MG): ORAL_TABLET | ORAL | 0 refills | Status: DC

## 2020-07-30 NOTE — Progress Notes (Signed)
ANTICOAGULATION CONSULT NOTE  Pharmacy Consult for Heparin Indication: atrial fibrillation and pulmonary embolus  No Known Allergies  Patient Measurements: Height: 6' (182.9 cm) Weight: 82.6 kg (182 lb 1.6 oz) (bedscale) IBW/kg (Calculated) : 77.6 Heparin Dosing Weight: 80.3 kg  Vital Signs: Temp: 97.7 F (36.5 C) (12/06 1156) Temp Source: Oral (12/06 1156) BP: 112/89 (12/06 1156) Pulse Rate: 103 (12/06 1156)  Labs: Recent Labs    07/27/20 1501 07/28/20 1100 07/28/20 1100 07/28/20 2127 07/28/20 2223 07/29/20 0513 07/29/20 0822 07/29/20 1000 07/29/20 1237 07/30/20 0431  HGB 14.5 14.8   < >  --   --  12.9*  --   --   --  11.9*  HCT 43.9 44.6  --   --   --  39.9  --   --   --  36.4*  PLT 325 309  --   --   --  268  --   --   --  260  APTT  --   --   --  >200* 81* 65*  --   --   --   --   LABPROT  --  14.3  --   --   --   --   --   --  14.1  --   INR  --  1.2  --   --   --   --   --   --  1.1  --   HEPARINUNFRC  --   --   --   --   --  0.65  --  0.56  --  0.39  CREATININE 1.04 0.98  --   --   --   --   --   --   --  0.77  TROPONINIHS  --  <2  --   --   --   --  <2  --   --   --    < > = values in this interval not displayed.    Estimated Creatinine Clearance: 133.4 mL/min (by C-G formula based on SCr of 0.77 mg/dL).   Assessment: 65 yoM that was recently admitted to [facility] for covid PNA and new onset afib. The patient was seen by cardiology yesterday and was started on Apixaban. Patient took his first dose of Apixaban this morning. The patient presents today with new onset of chest pain and was found to have a PE. Pharmacy has been asked to dose heparin. Baseline CBC WNL.  07/30/2020:  Daily Heparin level remains therapeutic on 1400 units/hr  Hgb slightly low; Plt stable WNL  No bleeding or infusion related issues reported by RN  Transition to Eliquis today  Goal of Therapy:  Heparin level 0.3-0.7 units/ml  Monitor platelets by anticoagulation  protocol: Yes   Plan:   Start Eliquis 10 mg PO bid x 7d followed by 5 mg PO bid thereafter  Stop heparin with first dose of Eliquis  Monitor for bleeding  CM benefits check for Eliquis pending  Pharmacy to provide Eliquis education and 30-day coupon prior to discharge  Reuel Boom, PharmD, BCPS [phone] 07/30/2020, 12:18 PM

## 2020-07-30 NOTE — Plan of Care (Signed)
  Problem: Clinical Measurements: Goal: Respiratory complications will improve Outcome: Adequate for Discharge   Problem: Activity: Goal: Risk for activity intolerance will decrease Outcome: Adequate for Discharge   Problem: Nutrition: Goal: Adequate nutrition will be maintained Outcome: Adequate for Discharge   Problem: Pain Managment: Goal: General experience of comfort will improve Outcome: Adequate for Discharge   Problem: Cardiac: Goal: Ability to achieve and maintain adequate cardiopulmonary perfusion will improve Outcome: Adequate for Discharge

## 2020-07-30 NOTE — TOC Benefit Eligibility Note (Signed)
Transition of Care [facility]) Benefit Eligibility Note    Patient Details  Name: William Baldwin MRN: [id number] Date of Birth: 1981/04/20   Medication/Dose: Xarelto 20 mg x 30 days  and Eliquis 77m 2 x day for 30 days according to ENorva Karvonenis already on profile  Covered?: Yes  Tier: 3 Drug  Prescription Coverage Preferred Pharmacy: local  Spoke with Person/Company/Phone Number:: Ellen/ [facility] [employer] [phone] Co-Pay: both Rx $0 copay  Prior Approval: No  Deductible: Met       FKerin SalenPhone Number: 07/30/2020, 1:04 PM

## 2020-07-31 ENCOUNTER — Telehealth: Payer: Self-pay | Admitting: Cardiovascular Disease

## 2020-07-31 ENCOUNTER — Telehealth: Payer: Self-pay

## 2020-07-31 NOTE — Telephone Encounter (Signed)
Transition Care Management Follow-up Telephone Call  Date of discharge and from where: 07/30/20-[place]  How have you been since you were released from the hospital? Feeling much better  Any questions or concerns? No  Items Reviewed:  Did the pt receive and understand the discharge instructions provided? Yes   Medications obtained and verified? Yes   Other? Yes   Any new allergies since your discharge? No   Dietary orders reviewed? Yes  Do you have support at home? Yes   Home Care and Equipment/Supplies: Were home health services ordered? no If so, what is the name of the agency? n/a  Has the agency set up a time to come to the patient's home? not applicable Were any new equipment or medical supplies ordered?  No What is the name of the medical supply agency? n/a Were you able to get the supplies/equipment? not applicable Do you have any questions related to the use of the equipment or supplies? No  Functional Questionnaire: (I = Independent and D = Dependent) ADLs: I  Bathing/Dressing- I  Meal Prep- I  Eating- I  Maintaining continence- I  Transferring/Ambulation- I  Managing Meds- I  Follow up appointments reviewed:   PCP Hospital f/u appt confirmed? Yes  Scheduled to see Dr. Bryan Lemma on 08/08/20 @ 4:00.  [facility] f/u appt confirmed? Yes  Scheduled to see Dr. Audie Box on 09/05/20 @ 9:20  Are transportation arrangements needed? No   If their condition worsens, is the pt aware to call PCP or go to the Emergency Dept.? Yes  Was the patient provided with contact information for the PCP's office or ED? Yes  Was to pt [id number] to call back with questions or concerns? Yes

## 2020-07-31 NOTE — Telephone Encounter (Signed)
He is covid positive with pneumonia on 07/28/2020. We need him to wait 2-3 weeks before he can get scheduled. I would advise him to keep his appointment with me in early January and we will get him on books soon after that.   [place] T. Audie Box, [place]  [address], [place], [place] [address] [phone]  5:32 PM

## 2020-07-31 NOTE — Telephone Encounter (Signed)
Patient would like to schedule the TEE and Cardioversion for his afib.  The patient was at [facility] recently for a blood clot and he would like to get these other procedures scheduled asap.

## 2020-08-01 NOTE — Telephone Encounter (Signed)
-  Spoke with pt and informed MD's is ok with moving up appointment date to 08/16/20. -Pt agreeable with plan.  -Appointment rescheduled.

## 2020-08-01 NOTE — Telephone Encounter (Signed)
Spoke with pt and advise of MD's recommendations. Pt stated he is not very happy and don't want to continue to drag this out. Pt requesting if either MD can go ahead and schedule cardioversion in 2-3 weeks or move up appointment date.

## 2020-08-01 NOTE — Telephone Encounter (Signed)
Thanks for taking care of this William Baldwin.  If I need to talk with him I certainly can this afternoon.   [place] T. Audie Box, [place]  [address] [place], [place] [address] [phone]  10:01 AM

## 2020-08-02 NOTE — Discharge Summary (Signed)
[facility] Discharge Summary   Patient: William Baldwin HBZ:[id number]  PCP: Ronnald Nian, DO  Date of admission: 07/28/2020   Date of discharge: 07/30/2020      Discharge Diagnoses:  Principal diagnosis Acute PE due to covid 19  Active Problems:   Pulmonary embolism ([place])   COVID-19 virus infection   AF (paroxysmal atrial fibrillation) ([place])  Admitted From: home Disposition:  Home   Recommendations for Outpatient Follow-up:  1. PCP: please follow up with PCP in 1 week 2. Follow up LABS/TEST:  nopne   Follow-up Information    Ronnald Nian, DO. Schedule an appointment as soon as possible for a visit in 1 week(s).   Specialty: Family Medicine Contact information: [place] [address] [phone]        O'Neal, Bassett Thomas, MD. Call.   Specialties: Internal Medicine, Cardiology, Radiology Why: as needed. Contact information: [place] [address] [phone]              Diet recommendation: Cardiac diet  Activity: The patient is advised to gradually reintroduce usual activities, as tolerated  Discharge Condition: stable  Code Status: Full code   History of present illness: As per the H and P dictated on admission, "William Baldwin is a 39 y.o. male with a pertinent history of hospitalized with Covid 11/22 (positive date) who presents [facility] emergency department with left-sided chest pain.   Pt states that 2-3 weeks ago he developed an URI and was positive for covid at home on a study.  Then a few days later presented to an urgent care where he was seen to be in afib and found to be covid positive again.  He was doing okay for a few days then became sob and was found to be hypoxic and was hospitalized at [facility], ~25-28th of November was sent home on room air, satting 90-94%, dx'ed with new onset Afib and cardiovert in about 4 weeks.  He took one dose of eliquis and then on 12/4 he developed  sudden left chest pain that was intractable.  10/10.  Denies hemoptysis, h/o other dvt.  Then he presented to [facility] ED where they did a CT scan and found a new PE and he was still in afib with some increased heart rates.  Started on heparin gtt, BNP, Trops okay.  On a few L's of oxygen for his covid possibly because shallow breathing.  He was then admitted at [facility] for cardiology evaluation including TTE and cardiology was to see today.  He has felt fatigued and is not sure if he can feel when he is in A. fib and to tease this out from just being Covid positive  ED provider talked with caridology, start on heparin and bring in for echo, tachycardic some initially in the ED but hemodynamically stable, fair amount of pain.  Wbc to 14.2 and started on CAP coverage with Azithro and Ceftriaxone since he felt somewhat sob, but possibly because of not wanting to take a deep breath.  Imaging showed signs of pneumonia (CT scan).  CT scan did not notice R heart strain. Trop <2"  Hospital Course:  Summary of his active problems in the hospital is as following. New onset PE - started on heparin gtt in the ED, Changed to oral apixaban  No hypoxia on exertion  No orthostatic HR mildly up. Pt asymptomatic. Wants to go home.  TSH, free t4, tt3 normal  Atrial fibrillation with RVR  Following with [facility] cardiology Consulted cardiology, appreciate their recommendations Continue lopressor On apixaban  Bilateral pulmonary infiltrates concerning for CAP No Antibiotics as no fever or chills  Pro cal also very low  Covid positive,  Mild hypoxia resolved No respiratory failure,  I think the a component is from shallow breathing,  will go ahead and finish a few days of dexamethasone  Pain control  No pain reported by pt.   Patient was ambulatory without any assistance. On the day of the discharge the patient's vitals were stable, and no other acute medical condition were  reported by patient. The patient was felt safe to be discharge at Home with no therapy needed on discharge.  Consultants: Cardiology Procedures: noen  Discharge Exam: General: Appear in no distress, no Rash; Oral Mucosa Clear, moist. no Abnormal Neck Mass Or lumps, Conjunctiva normal  Cardiovascular: S1 and S2 Present, no Murmur Respiratory: good respiratory effort, Bilateral Air entry present and CTA, no Crackles, no wheezes Abdomen: Bowel Sound present, Soft and no tenderness Extremities: no Pedal edema Neurology: alert and oriented to time, place, and person affect appropriate. no new focal deficit  Filed Weights   07/28/20 1105 07/29/20 0430 07/30/20 0500  Weight: 80.3 kg 81.2 kg 82.6 kg   Vitals:   07/30/20 1156 07/30/20 1449  BP: 112/89   Pulse: (!) 103   Resp: 18   Temp: 97.7 F (36.5 C)   SpO2: 94% 95%    DISCHARGE MEDICATION: Allergies as of 07/30/2020   No Known Allergies     Medication List    STOP taking these medications   apixaban 5 MG Tabs tablet Commonly known as: ELIQUIS Replaced by: Apixaban Starter Pack (10mg  and 5mg )     TAKE these medications   Apixaban Starter Pack (10mg  and 5mg ) Commonly known as: ELIQUIS STARTER PACK Take as directed on package: start with two-5mg  tablets twice daily for 7 days. On day 8, switch to one-5mg  tablet twice daily. Replaces: apixaban 5 MG Tabs tablet   dexamethasone 6 MG tablet Commonly known as: DECADRON Take 1 tablet (6 mg total) by mouth daily for 7 days.   metoprolol succinate 25 MG 24 hr tablet Commonly known as: Toprol XL Take 1 tablet (25 mg total) by mouth daily.   pantoprazole 40 MG tablet Commonly known as: PROTONIX Take 1 tablet (40 mg total) by mouth daily.      No Known Allergies Discharge Instructions    Diet - low sodium heart healthy   Complete by: As directed    Increase activity slowly   Complete by: As directed       The results of significant diagnostics from this  hospitalization (including imaging, microbiology, ancillary and laboratory) are listed below for reference.    Significant Diagnostic Studies: CT Angio Chest PE W and/or Wo Contrast  Result Date: 07/28/2020 CLINICAL DATA:  History of COVID-19 pneumonia with increasing left-sided chest pain, initial encounter EXAM: CT ANGIOGRAPHY CHEST WITH CONTRAST TECHNIQUE: Multidetector CT imaging of the chest was performed using the standard protocol during bolus administration of intravenous contrast. Multiplanar CT image reconstructions and MIPs were obtained to evaluate the vascular anatomy. CONTRAST:  178mL OMNIPAQUE IOHEXOL 350 MG/ML SOLN COMPARISON:  Chest x-ray from earlier in the same day. FINDINGS: Cardiovascular: Thoracic aorta and its branches are within normal limits. The pulmonary artery shows a normal branching pattern. No definitive emboli are noted on the right. In the left lower lobe pulmonary artery there is branching filling defect  identified consistent with acute pulmonary emboli. No right heart strain is noted. Mediastinum/Nodes: Thoracic inlet is within normal limits. Scattered small mediastinal lymph nodes are noted likely reactive in nature. The esophagus as visualized is within normal limits. Lungs/Pleura: Lungs are well aerated bilaterally. Patchy airspace opacities are noted consistent with the given clinical history of COVID-19 pneumonia. More focal bilateral lower lobe infiltrate is seen. Upper Abdomen: No acute abnormality. Musculoskeletal: No acute bony abnormality is noted. Review of the MIP images confirms the above findings. IMPRESSION: New acute left lower lobe pulmonary emboli. This is likely the etiology of the patient's known left-sided pleuritic chest pain. Diffuse airspace opacities primarily in the lower lobes bilaterally consistent with the given clinical history of COVID-19. Electronically Signed   By: Inez Catalina M.D.   On: 07/28/2020 12:59   US Venous Img Lower  Bilateral  Result Date: 07/28/2020 CLINICAL DATA:  Left leg pain for 1 day, new left sided pulmonary emboli EXAM: BILATERAL LOWER EXTREMITY VENOUS DOPPLER ULTRASOUND TECHNIQUE: Gray-scale sonography with graded compression, as well as color Doppler and duplex ultrasound were performed to evaluate the lower extremity deep venous systems from the level of the common femoral vein and including the common femoral, femoral, profunda femoral, popliteal and calf veins including the posterior tibial, peroneal and gastrocnemius veins when visible. The superficial great saphenous vein was also interrogated. Spectral Doppler was utilized to evaluate flow at rest and with distal augmentation maneuvers in the common femoral, femoral and popliteal veins. COMPARISON:  None. FINDINGS: RIGHT LOWER EXTREMITY Common Femoral Vein: No evidence of thrombus. Normal compressibility, respiratory phasicity and response to augmentation. Saphenofemoral Junction: No evidence of thrombus. Normal compressibility and flow on color Doppler imaging. Profunda Femoral Vein: No evidence of thrombus. Normal compressibility and flow on color Doppler imaging. Femoral Vein: No evidence of thrombus. Normal compressibility, respiratory phasicity and response to augmentation. Slow flow is noted. Popliteal Vein: No evidence of thrombus. Normal compressibility, respiratory phasicity and response to augmentation. Calf Veins: No evidence of thrombus. Normal compressibility and flow on color Doppler imaging. Superficial Great Saphenous Vein: No evidence of thrombus. Normal compressibility. Venous Reflux:  None. Other Findings:  None. LEFT LOWER EXTREMITY Common Femoral Vein: No evidence of thrombus. Normal compressibility, respiratory phasicity and response to augmentation. Saphenofemoral Junction: No evidence of thrombus. Normal compressibility and flow on color Doppler imaging. Profunda Femoral Vein: No evidence of thrombus. Normal compressibility and flow on  color Doppler imaging. Femoral Vein: No evidence of thrombus. Normal compressibility, respiratory phasicity and response to augmentation. Slow flow is noted. Popliteal Vein: No evidence of thrombus. Normal compressibility, respiratory phasicity and response to augmentation. Calf Veins: No evidence of thrombus. Normal compressibility and flow on color Doppler imaging. Superficial Great Saphenous Vein: No evidence of thrombus. Normal compressibility. Venous Reflux:  None. Other Findings:  None. IMPRESSION: No evidence of deep venous thrombosis in either lower extremity. Slow flow is noted within the femoral veins bilaterally without definitive thrombus. Electronically Signed   By: Inez Catalina M.D.   On: 07/28/2020 15:00   DG Chest Port 1 View  Result Date: 07/28/2020 CLINICAL DATA:  LEFT anterior chest pain for 2 days. EXAM: PORTABLE CHEST 1 VIEW COMPARISON:  None. FINDINGS: Ill-defined, predominantly ground-glass, opacities within the lower lungs bilaterally, highly suggestive of multifocal pneumonia. Upper lungs are relatively clear. No pleural effusion or pneumothorax is seen. Heart size and mediastinal contours are within normal limits. Osseous structures about the chest are unremarkable. IMPRESSION: Probable bilateral pneumonia. Electronically Signed  By: Franki Cabot M.D.   On: 07/28/2020 11:36   ECHOCARDIOGRAM COMPLETE  Result Date: 07/29/2020    ECHOCARDIOGRAM REPORT   Patient Name:   REQUAN HARDGE Date of Exam: 07/29/2020 Medical Rec #:  [id number]     Height:       72.0 in Accession #:    [id number]    Weight:       179.0 lb Date of Birth:  18-Mar-1981     BSA:          2.033 m Patient Age:    17 years      BP:           116/69 mmHg Patient Gender: M             HR:           91 bpm. Exam Location:  Inpatient Procedure: 2D Echo Indications:    pulmonary embolus  History:        Patient has no prior history of Echocardiogram examinations.                 Arrythmias:Atrial Fibrillation. Covid +.   Sonographer:    Jannett Celestine RDCS (AE) Referring Phys: [id number] [place]  1. Left ventricular ejection fraction, by estimation, is 55 to 60%. The left ventricle has normal function. The left ventricle has no regional wall motion abnormalities. Left ventricular diastolic function could not be evaluated.  2. Right ventricular systolic function is normal. The right ventricular size is normal. Tricuspid regurgitation signal is inadequate for assessing PA pressure.  3. The mitral valve is normal in structure. Trivial mitral valve regurgitation. No evidence of mitral stenosis.  4. The aortic valve is grossly normal. Aortic valve regurgitation is not visualized. No aortic stenosis is present.  5. The inferior vena cava is dilated in size with <50% respiratory variability, suggesting right atrial pressure of 15 mmHg. Comparison(s): No prior Echocardiogram. Conclusion(s)/Recommendation(s): Otherwise normal echocardiogram, with minor abnormalities described in the report. FINDINGS  Left Ventricle: Left ventricular ejection fraction, by estimation, is 55 to 60%. The left ventricle has normal function. The left ventricle has no regional wall motion abnormalities. The left ventricular internal cavity size was normal in size. There is  borderline concentric left ventricular hypertrophy. Left ventricular diastolic function could not be evaluated due to atrial fibrillation. Left ventricular diastolic function could not be evaluated. Right Ventricle: The right ventricular size is normal. Right vetricular wall thickness was not well visualized. Right ventricular systolic function is normal. Tricuspid regurgitation signal is inadequate for assessing PA pressure. Left Atrium: Left atrial size was normal in size. Right Atrium: Right atrial size was normal in size. Pericardium: There is no evidence of pericardial effusion. Mitral Valve: The mitral valve is normal in structure. Trivial mitral valve regurgitation. No  evidence of mitral valve stenosis. Tricuspid Valve: The tricuspid valve is normal in structure. Tricuspid valve regurgitation is trivial. No evidence of tricuspid stenosis. Aortic Valve: The aortic valve is grossly normal. Aortic valve regurgitation is not visualized. No aortic stenosis is present. Pulmonic Valve: The pulmonic valve was not well visualized. Pulmonic valve regurgitation is not visualized. No evidence of pulmonic stenosis. Aorta: The aortic root, ascending aorta, aortic arch and descending aorta are all structurally normal, with no evidence of dilitation or obstruction. Venous: The inferior vena cava is dilated in size with less than 50% respiratory variability, suggesting right atrial pressure of 15 mmHg. IAS/Shunts: The atrial septum is grossly normal.  LEFT VENTRICLE PLAX 2D  LVIDd:         4.00 cm LVIDs:         2.70 cm LV PW:         1.20 cm LV IVS:        1.20 cm LVOT diam:     2.40 cm LV SV:         66 LV SV Index:   32 LVOT Area:     4.52 cm  RIGHT VENTRICLE TAPSE (M-mode): 2.1 cm LEFT ATRIUM         Index LA diam:    3.10 cm 1.53 cm/m  AORTIC VALVE LVOT Vmax:   87.50 cm/s LVOT Vmean:  59.100 cm/s LVOT VTI:    0.146 m  AORTA Ao Root diam: 2.80 cm MITRAL VALVE MV Area (PHT): 2.69 cm    SHUNTS MV Decel Time: 282 msec    Systemic VTI:  0.15 m MV E velocity: 93.40 cm/s  Systemic Diam: 2.40 cm Buford Dresser MD Electronically signed by Buford Dresser MD Signature Date/Time: 07/29/2020/1:42:19 PM    Final     Microbiology: Recent Results (from the past 240 hour(s))  Resp Panel by RT-PCR (Flu A&B, Covid) Nasopharyngeal Swab     Status: Abnormal   Collection Time: 07/28/20  3:24 PM   Specimen: Nasopharyngeal Swab; Nasopharyngeal(NP) swabs in vial transport medium  Result Value Ref Range Status   SARS Coronavirus 2 by RT PCR POSITIVE (A) NEGATIVE Final    Comment: RESULT CALLED TO, READ BACK BY AND VERIFIED WITH: REED, C, RN AT 3532 ON [id number] BY BOWLBY, J (NOTE) SARS-CoV-2  target nucleic acids are DETECTED.  The SARS-CoV-2 RNA is generally detectable in upper respiratory specimens during the acute phase of infection. Positive results are indicative of the presence of the identified virus, but do not rule out bacterial infection or co-infection with other pathogens not detected by the test. Clinical correlation with patient history and other diagnostic information is necessary to determine patient infection status. The expected result is Negative.  Fact Sheet for Patients: EntrepreneurPulse.com.au  Fact Sheet for Healthcare Providers: IncredibleEmployment.be  This test is not yet approved or cleared by the [place] FDA and  has been authorized for detection and/or diagnosis of SARS-CoV-2 by FDA under an Emergency Use Authorization (EUA).  This EUA will remain in effect (meaning this  test can be used) for the duration of  the COVID-19 declaration under Section 564(b)(1) of the Act, 21 U.S.C. section 360bbb-3(b)(1), unless the authorization is terminated or revoked sooner.     Influenza A by PCR NEGATIVE NEGATIVE Final   Influenza B by PCR NEGATIVE NEGATIVE Final    Comment: (NOTE) The Xpert Xpress SARS-CoV-2/FLU/RSV plus assay is intended as an aid in the diagnosis of influenza from Nasopharyngeal swab specimens and should not be used as a sole basis for treatment. Nasal washings and aspirates are unacceptable for Xpert Xpress SARS-CoV-2/FLU/RSV testing.  Fact Sheet for Patients: EntrepreneurPulse.com.au  Fact Sheet for Healthcare Providers: IncredibleEmployment.be  This test is not yet approved or cleared by the [place] FDA and has been authorized for detection and/or diagnosis of SARS-CoV-2 by FDA under an Emergency Use Authorization (EUA). This EUA will remain in effect (meaning this test can be used) for the duration of the COVID-19 declaration under Section  564(b)(1) of the Act, 21 U.S.C. section 360bbb-3(b)(1), unless the authorization is terminated or revoked.  Performed at [facility], [address], [place], [place] [address]  Labs: CBC: Recent Labs  Lab 07/27/20 1501 07/28/20 1100 07/29/20 0513 07/30/20 0431  WBC 10.1 14.2* 11.9* 9.0  HGB 14.5 14.8 12.9* 11.9*  HCT 43.9 44.6 39.9 36.4*  MCV 88 88.1 91.3 90.5  PLT [phone] 811   Basic Metabolic Panel: Recent Labs  Lab 07/27/20 1501 07/28/20 1100 07/29/20 0822 07/30/20 0431  NA 140 139  --  140  K 4.6 3.9  --  4.4  CL 100 101  --  103  CO2 25 25  --  27  GLUCOSE 100* 100*  --  147*  BUN 15 13  --  9  CREATININE 1.04 0.98  --  0.77  CALCIUM 8.9 9.1  --  8.9  MG  --   --  2.3  --    Liver Function Tests: No results for input(s): AST, ALT, ALKPHOS, BILITOT, PROT, ALBUMIN in the last 168 hours. CBG: No results for input(s): GLUCAP in the last 168 hours.  Time spent: 35 minutes  Signed:  Berle Mull  [facility] 07/30/2020

## 2020-08-08 ENCOUNTER — Encounter: Payer: Self-pay | Admitting: Family Medicine

## 2020-08-08 ENCOUNTER — Ambulatory Visit (INDEPENDENT_AMBULATORY_CARE_PROVIDER_SITE_OTHER): Payer: 59 | Admitting: Family Medicine

## 2020-08-08 ENCOUNTER — Other Ambulatory Visit: Payer: Self-pay

## 2020-08-08 VITALS — BP 118/80 | HR 106 | Temp 98.2°F | Ht 72.0 in | Wt 182.0 lb

## 2020-08-08 DIAGNOSIS — Z09 Encounter for follow-up examination after completed treatment for conditions other than malignant neoplasm: Secondary | ICD-10-CM | POA: Diagnosis not present

## 2020-08-08 DIAGNOSIS — I480 Paroxysmal atrial fibrillation: Secondary | ICD-10-CM

## 2020-08-08 DIAGNOSIS — I2699 Other pulmonary embolism without acute cor pulmonale: Secondary | ICD-10-CM | POA: Diagnosis not present
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
## 2020-08-16 ENCOUNTER — Encounter: Payer: Self-pay | Admitting: Cardiovascular Disease

## 2020-08-16 ENCOUNTER — Other Ambulatory Visit: Payer: Self-pay

## 2020-08-16 ENCOUNTER — Ambulatory Visit (INDEPENDENT_AMBULATORY_CARE_PROVIDER_SITE_OTHER): Payer: 59 | Admitting: Cardiovascular Disease

## 2020-08-16 VITALS — BP 129/88 | HR 80 | Ht 73.0 in | Wt 181.0 lb

## 2020-08-16 DIAGNOSIS — I4819 Other persistent atrial fibrillation: Secondary | ICD-10-CM | POA: Diagnosis not present

## 2020-08-16 DIAGNOSIS — R0602 Shortness of breath: Secondary | ICD-10-CM

## 2020-08-16 DIAGNOSIS — Z01810 Encounter for preprocedural cardiovascular examination: Secondary | ICD-10-CM | POA: Diagnosis not present

## 2020-08-16 DIAGNOSIS — I2699 Other pulmonary embolism without acute cor pulmonale: Secondary | ICD-10-CM

## 2020-08-16 MED ORDER — APIXABAN 5 MG PO TABS: 5.0000 mg | ORAL_TABLET | Freq: Two times a day (BID) | ORAL | 3 refills | Status: DC

## 2020-08-16 NOTE — Patient Instructions (Addendum)
Medication Instructions:  Your physician recommends that you continue on your current medications as directed. Please refer to the Current Medication list given to you today.  *If you need a refill on your cardiac medications before your next appointment, please call your pharmacy*   Lab Work: Your physician recommends that you return for lab work 1 week prior to Cardioversion:   BMET  CBC  You will need a COVID-19  test prior to your procedure. You are scheduled for Thursday 08/23/2020 at 11:15 AM. This is a Drive Up Visit at [address] [place], [place] [address]. Someone will direct you to the appropriate testing line. Stay in your car and someone will be with you shortly.   If you have labs (blood work) drawn today and your tests are completely normal, you will receive your results only by: Marland Kitchen MyChart Message (if you have MyChart) OR . A paper copy in the mail If you have any lab test that is abnormal or we need to change your treatment, we will call you to review the results.  Testing/Procedures: Your physician has recommended that you have a Cardioversion (DCCV). Electrical Cardioversion uses a jolt of electricity to your heart either through paddles or wired patches attached to your chest. This is a controlled, usually prescheduled, procedure. Defibrillation is done under light anesthesia in the hospital, and you usually go home the day of the procedure. This is done to get your heart back into a normal rhythm. You are not awake for the procedure. Please see the instruction sheet given to you today.  Follow-Up: At [facility], you and your health needs are our priority.  As part of our continuing mission to provide you with exceptional heart care, we have created designated Provider Care Teams.  These Care Teams include your primary Cardiologist (physician) and Advanced Practice Providers (APPs -  Physician Assistants and Nurse Practitioners) who all work together to provide  you with the care you need, when you need it.  Your next appointment:   4 week(s)  The format for your next appointment:   In Person  Provider:   Eleonore Chiquito, MD  Other Instructions You have been referred to Pulmonary     Dear [name],  You are scheduled for a Cardioversion on Monday 08/27/2020 with Dr. Buford Dresser.  Please arrive at the [facility] (Main Entrance A) at [facility]: [address] [place],  [address] at 9:00 AM. (1 hour prior to procedure unless lab work is needed; if lab work is needed arrive 1.5 hours ahead)  DIET: Nothing to eat or drink after midnight except a sip of water with medications (see medication instructions below)  Medication Instructions:  Continue your anticoagulant: Eliquis (Apixaban) You will need to continue your anticoagulant after your procedure until you  are told by your  Provider that it is safe to stop   Labs: If patient is on Coumadin, patient needs pt/INR, CBC, BMET within 3 days (No pt/INR needed for patients taking Xarelto, Eliquis, Pradaxa) For patients receiving anesthesia for TEE and all Cardioversion patients: BMET, CBC within 1 week  Come to the lab at [address] between the hours of 8:00 am and 4:30 pm. You do not have to be fasting.   You must have a responsible person to drive you home and stay in the waiting area during your procedure. Failure to do so could result in cancellation.  Bring your insurance cards.  *Special Note:  Every effort is made to have your procedure done on time. Occasionally there are emergencies that occur at the hospital that may cause delays. Please be patient if a delay does occur.

## 2020-08-20 ENCOUNTER — Telehealth: Payer: Self-pay | Admitting: Cardiovascular Disease

## 2020-08-20 NOTE — Telephone Encounter (Signed)
I contacted hospital to see if any cancellations for cardioversion, and they stated no open spots for now- patient verbalized understanding, we will call again tomorrow.

## 2020-08-20 NOTE — Telephone Encounter (Signed)
Patient calling back to speak with a nurse about his cardioversion.

## 2020-08-20 NOTE — Telephone Encounter (Signed)
Patient is calling Dr. Flora Lipps or nurse regarding his upcoming cardioversion. Please call to discuss

## 2020-08-20 NOTE — Telephone Encounter (Signed)
Spoke with patient regarding appointment with Dr. Loanne Drilling at [facility]monary scheduled Monday 09/17/20 at 3:30 pm---[address], [place], [place] ---Phone [phone].  Will mail information to patient and it is also in My Chart.  Patient voiced his understanding

## 2020-08-21 ENCOUNTER — Other Ambulatory Visit (HOSPITAL_COMMUNITY)
Admission: RE | Admit: 2020-08-21 | Discharge: 2020-08-21 | Disposition: A | Discharge status: Home / Self Care | Payer: 59 | Source: Ambulatory Visit | Attending: Cardiology | Admitting: Cardiology

## 2020-08-21 ENCOUNTER — Encounter: Payer: Self-pay | Admitting: Family Medicine

## 2020-08-21 ENCOUNTER — Telehealth: Payer: Self-pay | Admitting: Cardiovascular Disease

## 2020-08-21 DIAGNOSIS — Z20822 Contact with and (suspected) exposure to covid-19: Secondary | ICD-10-CM | POA: Diagnosis not present

## 2020-08-21 DIAGNOSIS — Z01812 Encounter for preprocedural laboratory examination: Secondary | ICD-10-CM | POA: Diagnosis not present

## 2020-08-21 NOTE — Telephone Encounter (Signed)
Routing to RN

## 2020-08-21 NOTE — Telephone Encounter (Signed)
Called and was able to get patient scheduled for Cardioverison tomorrow- moved covid testing for today, and blood work for today as well.   Patient verbalized understanding- will go to hospital tomorrow at 12, for cardioversion at 1:00 PM.

## 2020-08-21 NOTE — Anesthesia Preprocedure Evaluation (Addendum)
Anesthesia Evaluation  Patient identified by MRN, date of birth, ID band Patient awake    Reviewed: Allergy & Precautions, NPO status , Patient's Chart, lab work & pertinent test results  Airway Mallampati: II  TM Distance: >3 FB Neck ROM: Full    Dental no notable dental hx. (+) Teeth Intact, Dental Advisory Given   Pulmonary neg pulmonary ROS,    Pulmonary exam normal breath sounds clear to auscultation       Cardiovascular Exercise Tolerance: Good Normal cardiovascular exam+ dysrhythmias Atrial Fibrillation  Rhythm:Regular Rate:Normal  07/29/2020 Echo  1. Left ventricular ejection fraction, by estimation, is 55 to 60%. The  left ventricle has normal function. The left ventricle has no regional  wall motion abnormalities. Left ventricular diastolic function could not  be evaluated.  2. Right ventricular systolic function is normal. The right ventricular  size is normal. Tricuspid regurgitation signal is inadequate for assessing  PA pressure.  3. The mitral valve is normal in structure. Trivial mitral valve  regurgitation. No evidence of mitral stenosis.  4. The aortic valve is grossly normal. Aortic valve regurgitation is not  visualized. No aortic stenosis is present.  5. The inferior vena cava is dilated in size with <50% respiratory    Neuro/Psych negative neurological ROS  negative psych ROS   GI/Hepatic negative GI ROS, Neg liver ROS,   Endo/Other  negative endocrine ROS  Renal/GU negative Renal ROS     Musculoskeletal   Abdominal   Peds  Hematology negative hematology ROS (+)   Anesthesia Other Findings   Reproductive/Obstetrics                            Anesthesia Physical Anesthesia Plan  ASA: II  Anesthesia Plan: General   Post-op Pain Management:    Induction:   PONV Risk Score and Plan: Treatment may vary due to age or medical condition  Airway Management  Planned: Natural Airway and Nasal Cannula  Additional Equipment: None  Intra-op Plan:   Post-operative Plan:   Informed Consent: I have reviewed the patients History and Physical, chart, labs and discussed the procedure including the risks, benefits and alternatives for the proposed anesthesia with the patient or authorized representative who has indicated his/her understanding and acceptance.     Dental advisory given  Plan Discussed with: CRNA and Anesthesiologist  Anesthesia Plan Comments:        Anesthesia Quick Evaluation

## 2020-08-21 NOTE — Telephone Encounter (Signed)
Patient wanted to talk with Dr. Flora Lipps or nurse about moving up his cardioversion. Please call back

## 2020-08-22 ENCOUNTER — Encounter (HOSPITAL_COMMUNITY)
Admission: RE | Disposition: A | Discharge status: Home / Self Care | Payer: Self-pay | Source: Home / Self Care | Attending: Cardiology

## 2020-08-22 ENCOUNTER — Ambulatory Visit (HOSPITAL_COMMUNITY): Payer: 59 | Admitting: Anesthesiology

## 2020-08-22 ENCOUNTER — Other Ambulatory Visit: Payer: Self-pay

## 2020-08-22 ENCOUNTER — Encounter (HOSPITAL_COMMUNITY): Payer: Self-pay | Admitting: Cardiology

## 2020-08-22 ENCOUNTER — Ambulatory Visit (HOSPITAL_COMMUNITY)
Admission: RE | Admit: 2020-08-22 | Discharge: 2020-08-22 | Disposition: A | Discharge status: Home / Self Care | Payer: 59 | Attending: Cardiology | Admitting: Cardiology

## 2020-08-22 DIAGNOSIS — Z8616 Personal history of COVID-19: Secondary | ICD-10-CM | POA: Insufficient documentation

## 2020-08-22 DIAGNOSIS — Z7901 Long term (current) use of anticoagulants: Secondary | ICD-10-CM | POA: Insufficient documentation

## 2020-08-22 DIAGNOSIS — R0602 Shortness of breath: Secondary | ICD-10-CM | POA: Diagnosis not present

## 2020-08-22 DIAGNOSIS — I4819 Other persistent atrial fibrillation: Secondary | ICD-10-CM | POA: Diagnosis not present

## 2020-08-22 DIAGNOSIS — Z79899 Other long term (current) drug therapy: Secondary | ICD-10-CM | POA: Diagnosis not present

## 2020-08-22 DIAGNOSIS — I2699 Other pulmonary embolism without acute cor pulmonale: Secondary | ICD-10-CM | POA: Diagnosis not present

## 2020-08-22 HISTORY — PX: CARDIOVERSION: SHX1299

## 2020-08-22 LAB — BASIC METABOLIC PANEL
BUN/Creatinine Ratio: 16 (ref 9–20)
BUN: 13 mg/dL (ref 6–24)
CO2: 24 mmol/L (ref 20–29)
Calcium: 9.6 mg/dL (ref 8.7–10.2)
Chloride: 107 mmol/L — ABNORMAL HIGH (ref 96–106)
Creatinine, Ser: 0.82 mg/dL (ref 0.76–1.27)
GFR calc Af Amer: 127 mL/min/{1.73_m2} (ref >59)
GFR calc non Af Amer: 110 mL/min/{1.73_m2} (ref >59)
Glucose: 87 mg/dL (ref 65–99)
Potassium: 4.5 mmol/L (ref 3.5–5.2)
Sodium: 146 mmol/L — ABNORMAL HIGH (ref 134–144)

## 2020-08-22 LAB — CBC
Hematocrit: 43.9 % (ref 37.5–51.0)
Hemoglobin: 14.4 g/dL (ref 13.0–17.7)
MCH: 29.3 pg (ref 26.6–33.0)
MCHC: 32.8 g/dL (ref 31.5–35.7)
MCV: 89 fL (ref 79–97)
Platelets: 189 10*3/uL (ref 150–450)
RBC: 4.92 x10E6/uL (ref 4.14–5.80)
RDW: 13.7 % (ref 11.6–15.4)
WBC: 6.4 10*3/uL (ref 3.4–10.8)

## 2020-08-22 LAB — SARS CORONAVIRUS 2 (TAT 6-24 HRS): SARS Coronavirus 2: NEGATIVE

## 2020-08-22 SURGERY — CARDIOVERSION
Anesthesia: General

## 2020-08-22 MED ORDER — PROPOFOL 10 MG/ML IV BOLUS
INTRAVENOUS | Status: DC | PRN
  Administered 2020-08-22: 100 mg via INTRAVENOUS
  Administered 2020-08-22: 20 mg via INTRAVENOUS
  Administered 2020-08-22: 30 mg via INTRAVENOUS

## 2020-08-22 MED ORDER — LIDOCAINE HCL (CARDIAC) PF 100 MG/5ML IV SOSY
PREFILLED_SYRINGE | INTRAVENOUS | Status: DC | PRN
  Administered 2020-08-22: 100 mg via INTRATRACHEAL

## 2020-08-22 MED ORDER — SODIUM CHLORIDE 0.9 % IV SOLN: INTRAVENOUS | Status: DC | PRN

## 2020-08-22 NOTE — Anesthesia Postprocedure Evaluation (Signed)
Anesthesia Post Note  Patient: William Baldwin  Procedure(s) Performed: CARDIOVERSION (N/A )     Patient location during evaluation: Endoscopy Anesthesia Type: General Level of consciousness: awake and alert Pain management: pain level controlled Vital Signs Assessment: post-procedure vital signs reviewed and stable Respiratory status: spontaneous breathing, nonlabored ventilation, respiratory function stable and patient connected to nasal cannula oxygen Cardiovascular status: blood pressure returned to baseline and stable Postop Assessment: no apparent nausea or vomiting Anesthetic complications: no   No complications documented.  Last Vitals:  Vitals:   08/22/20 1052 08/22/20 1100  BP: 109/80 117/90  Pulse: 91 71  Resp: (!) 21 11  Temp: 37.2 C   SpO2: 96% 97%    Last Pain:  Vitals:   08/22/20 1100  TempSrc:   PainSc: 0-No pain                 Trevor Iha

## 2020-08-22 NOTE — CV Procedure (Signed)
    Electrical Cardioversion Procedure Note William Baldwin [id number] Dec 10, 1980  Procedure: Electrical Cardioversion Indications:  AFIB  Time Out: Verified patient identification, verified procedure,medications/allergies/relevent history reviewed, required imaging and test results available.  Performed  Procedure Details  The patient was NPO after midnight. Anesthesia was administered at the beside  by Dr. Richardson Landry with 140mg  of propofol.  Cardioversion was performed with synchronized biphasic defibrillation via AP pads with 200 joules.  3 attempt(s) were performed (with last two towel compression).  The patient converted to normal sinus rhythm. The patient tolerated the procedure well   IMPRESSION:  Successful cardioversion of atrial fibrillation after 3 shocks.   08/22/2020, 10:50 AM

## 2020-08-22 NOTE — Transfer of Care (Signed)
Immediate Anesthesia Transfer of Care Note  Patient: William Baldwin  Procedure(s) Performed: CARDIOVERSION (N/A )  Patient Location: Endoscopy Unit  Anesthesia Type:General  Level of Consciousness: drowsy and patient cooperative  Airway & Oxygen Therapy: Patient Spontanous Breathing  Post-op Assessment: Report given to RN and Post -op Vital signs reviewed and stable  Post vital signs: Reviewed and stable  Last Vitals:  Vitals Value Taken Time  BP 106/84   Temp    Pulse 87   Resp 18   SpO2 97     Last Pain:  Vitals:   08/22/20 1020  TempSrc: Temporal  PainSc: 0-No pain         Complications: No complications documented.

## 2020-08-22 NOTE — Anesthesia Procedure Notes (Signed)
Procedure Name: General with mask airway Date/Time: 08/22/2020 10:33 AM Performed by: Modena Morrow, CRNA Pre-anesthesia Checklist: Patient identified, Emergency Drugs available, Suction available and Patient being monitored Patient Re-evaluated:Patient Re-evaluated prior to induction Oxygen Delivery Method: Ambu bag Preoxygenation: Pre-oxygenation with 100% oxygen Induction Type: IV induction Ventilation: Mask ventilation without difficulty Placement Confirmation: positive ETCO2 Dental Injury: Teeth and Oropharynx as per pre-operative assessment

## 2020-08-22 NOTE — Discharge Instructions (Signed)
Electrical Cardioversion Electrical cardioversion is the delivery of a jolt of electricity to restore a normal rhythm to the heart. A rhythm that is too fast or is not regular keeps the heart from pumping well. In this procedure, sticky patches or metal paddles are placed on the chest to deliver electricity to the heart from a device. This procedure may be done in an emergency if:  There is low or no blood pressure as a result of the heart rhythm.  Normal rhythm must be restored as fast as possible to protect the brain and heart from further damage.  It may save a life. This may also be a scheduled procedure for irregular or fast heart rhythms that are not immediately life-threatening. Tell a health care provider about:  Any allergies you have.  All medicines you are taking, including vitamins, herbs, eye drops, creams, and over-the-counter medicines.  Any problems you or family members have had with anesthetic medicines.  Any blood disorders you have.  Any surgeries you have had.  Any medical conditions you have.  Whether you are pregnant or may be pregnant. What are the risks? Generally, this is a safe procedure. However, problems may occur, including:  Allergic reactions to medicines.  A blood clot that breaks free and travels to other parts of your body.  The possible return of an abnormal heart rhythm within hours or days after the procedure.  Your heart stopping (cardiac arrest). This is rare. What happens before the procedure? Medicines  Your health care provider may have you start taking: ? Blood-thinning medicines (anticoagulants) so your blood does not clot as easily. ? Medicines to help stabilize your heart rate and rhythm.  Ask your health care provider about: ? Changing or stopping your regular medicines. This is especially important if you are taking diabetes medicines or blood thinners. ? Taking medicines such as aspirin and ibuprofen. These medicines can  thin your blood. Do not take these medicines unless your health care provider tells you to take them. ? Taking over-the-counter medicines, vitamins, herbs, and supplements. General instructions  Follow instructions from your health care provider about eating or drinking restrictions.  Plan to have someone take you home from the hospital or clinic.  If you will be going home right after the procedure, plan to have someone with you for 24 hours.  Ask your health care provider what steps will be taken to help prevent infection. These may include washing your skin with a germ-killing soap. What happens during the procedure?   An IV will be inserted into one of your veins.  Sticky patches (electrodes) or metal paddles may be placed on your chest.  You will be given a medicine to help you relax (sedative).  An electrical shock will be delivered. The procedure may vary among health care providers and hospitals. What can I expect after the procedure?  Your blood pressure, heart rate, breathing rate, and blood oxygen level will be monitored until you leave the hospital or clinic.  Your heart rhythm will be watched to make sure it does not change.  You may have some redness on the skin where the shocks were given. Follow these instructions at home:  Do not drive for 24 hours if you were given a sedative during your procedure.  Take over-the-counter and prescription medicines only as told by your health care provider.  Ask your health care provider how to check your pulse. Check it often.  Rest for 48 hours after the procedure or   as told by your health care provider.  Avoid or limit your caffeine use as told by your health care provider.  Keep all follow-up visits as told by your health care provider. This is important. Contact a health care provider if:  You feel like your heart is beating too quickly or your pulse is not regular.  You have a serious muscle cramp that does not go  away. Get help right away if:  You have discomfort in your chest.  You are dizzy or you feel faint.  You have trouble breathing or you are short of breath.  Your speech is slurred.  You have trouble moving an arm or leg on one side of your body.  Your fingers or toes turn cold or blue. Summary  Electrical cardioversion is the delivery of a jolt of electricity to restore a normal rhythm to the heart.  This procedure may be done right away in an emergency or may be a scheduled procedure if the condition is not an emergency.  Generally, this is a safe procedure.  After the procedure, check your pulse often as told by your health care provider. This information is not intended to replace advice given to you by your health care provider. Make sure you discuss any questions you have with your health care provider. Document Revised: 03/14/2019 Document Reviewed: 03/14/2019 Elsevier Patient Education  2020 [employer]  

## 2020-08-22 NOTE — Telephone Encounter (Signed)
Patient states that the test results will not work. He needs a letter stating that he tested negative. He said the letter will prevent him from having any issues traveling out of the country. Please call him at [phone] and advise.

## 2020-08-22 NOTE — Telephone Encounter (Signed)
Patient stopped in the office to check the status of his letter. He states that he needs it by tomorrow because he will be going out of the country on Friday. Please advise.

## 2020-08-22 NOTE — Interval H&P Note (Signed)
History and Physical Interval Note:  08/22/2020 10:30 AM  William Baldwin  has presented today for surgery, with the diagnosis of AFIB.  The various methods of treatment have been discussed with the patient and family. After consideration of risks, benefits and other options for treatment, the patient has consented to  Procedure(s): CARDIOVERSION (N/A) as a surgical intervention.  The patient's history has been reviewed, patient examined, no change in status, stable for surgery.  I have reviewed the patient's chart and labs.  Questions were answered to the patient's satisfaction.     [employer]

## 2020-08-22 NOTE — Telephone Encounter (Signed)
Pt calling to ask if the letter that was just sent back him via MYchart, can be signed with written signature.  He will pick it up when it is ready.  Please call pt when ready. Thanks, CB# [phone]

## 2020-08-23 ENCOUNTER — Other Ambulatory Visit (HOSPITAL_COMMUNITY): Payer: 59

## 2020-08-23 NOTE — Telephone Encounter (Signed)
Sure that is fine by me.   Gerri Spore T. Flora Lipps, MD [facility]  [address] [place], [place] [address] [phone]  2:22 PM

## 2020-09-05 ENCOUNTER — Ambulatory Visit: Payer: 59 | Admitting: Cardiovascular Disease

## 2020-09-11 ENCOUNTER — Ambulatory Visit: Payer: 59 | Admitting: Cardiovascular Disease

## 2020-09-14 ENCOUNTER — Ambulatory Visit: Payer: 59 | Admitting: Cardiovascular Disease

## 2020-09-17 ENCOUNTER — Institutional Professional Consult (permissible substitution): Payer: 59 | Admitting: Pulmonary Disease
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
## 2020-09-25 ENCOUNTER — Encounter: Payer: Self-pay | Admitting: Cardiovascular Disease

## 2020-09-25 ENCOUNTER — Ambulatory Visit (INDEPENDENT_AMBULATORY_CARE_PROVIDER_SITE_OTHER): Payer: 59 | Admitting: Cardiovascular Disease

## 2020-09-25 ENCOUNTER — Other Ambulatory Visit: Payer: Self-pay

## 2020-09-25 VITALS — BP 120/82 | HR 72 | Ht 73.0 in | Wt 185.0 lb

## 2020-09-25 DIAGNOSIS — I2699 Other pulmonary embolism without acute cor pulmonale: Secondary | ICD-10-CM | POA: Diagnosis not present

## 2020-09-25 DIAGNOSIS — I4819 Other persistent atrial fibrillation: Secondary | ICD-10-CM

## 2020-09-25 NOTE — Patient Instructions (Signed)
Medication Instructions:  Stop Eliquis (10/26/2020) Stop Metoprolol  *If you need a refill on your cardiac medications before your next appointment, please call your pharmacy*  Follow-Up: At [facility], you and your health needs are our priority.  As part of our continuing mission to provide you with exceptional heart care, we have created designated Provider Care Teams.  These Care Teams include your primary Cardiologist (physician) and Advanced Practice Providers (APPs -  Physician Assistants and Nurse Practitioners) who all work together to provide you with the care you need, when you need it.  We recommend signing up for the patient portal called "MyChart".  Sign up information is provided on this After Visit Summary.  MyChart is used to connect with patients for Virtual Visits (Telemedicine).  Patients are able to view lab/test results, encounter notes, upcoming appointments, etc.  Non-urgent messages can be sent to your provider as well.   To learn more about what you can do with MyChart, go to [email or web address].    Your next appointment:   12 month(s)  The format for your next appointment:   In Person  Provider:   Eleonore Chiquito, MD

## 2020-09-27 ENCOUNTER — Encounter: Payer: Self-pay | Admitting: Family Medicine

## 2020-09-27 ENCOUNTER — Institutional Professional Consult (permissible substitution): Payer: 59 | Admitting: Pulmonary Disease

## 2020-09-27 DIAGNOSIS — G8929 Other chronic pain: Secondary | ICD-10-CM

## 2020-09-27 DIAGNOSIS — M25562 Pain in left knee: Secondary | ICD-10-CM

## 2020-10-04 ENCOUNTER — Encounter: Payer: Self-pay | Admitting: Orthopaedic Surgery

## 2020-10-04 ENCOUNTER — Ambulatory Visit (INDEPENDENT_AMBULATORY_CARE_PROVIDER_SITE_OTHER): Payer: 59 | Admitting: Orthopaedic Surgery

## 2020-10-04 ENCOUNTER — Ambulatory Visit: Payer: Self-pay

## 2020-10-04 VITALS — Ht 72.0 in | Wt 182.0 lb

## 2020-10-04 DIAGNOSIS — M25562 Pain in left knee: Secondary | ICD-10-CM | POA: Diagnosis not present

## 2020-10-04 DIAGNOSIS — G8929 Other chronic pain: Secondary | ICD-10-CM | POA: Diagnosis not present
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
## 2020-10-05 ENCOUNTER — Institutional Professional Consult (permissible substitution): Payer: 59 | Admitting: Pulmonary Disease

## 2021-01-25 ENCOUNTER — Other Ambulatory Visit: Payer: Self-pay

## 2021-01-25 ENCOUNTER — Emergency Department (HOSPITAL_BASED_OUTPATIENT_CLINIC_OR_DEPARTMENT_OTHER)
Admission: EM | Admit: 2021-01-25 | Discharge: 2021-01-25 | Disposition: A | Discharge status: Home / Self Care | Payer: 59 | Attending: Emergency Medicine | Admitting: Emergency Medicine

## 2021-01-25 ENCOUNTER — Encounter (HOSPITAL_BASED_OUTPATIENT_CLINIC_OR_DEPARTMENT_OTHER): Payer: Self-pay | Admitting: Emergency Medicine

## 2021-01-25 DIAGNOSIS — H9202 Otalgia, left ear: Secondary | ICD-10-CM | POA: Diagnosis not present

## 2021-01-25 DIAGNOSIS — Z7901 Long term (current) use of anticoagulants: Secondary | ICD-10-CM | POA: Diagnosis not present

## 2021-01-25 DIAGNOSIS — R590 Localized enlarged lymph nodes: Secondary | ICD-10-CM | POA: Diagnosis not present

## 2021-01-25 DIAGNOSIS — R000 Tachycardia, unspecified: Secondary | ICD-10-CM | POA: Diagnosis not present

## 2021-01-25 DIAGNOSIS — R5383 Other fatigue: Secondary | ICD-10-CM | POA: Insufficient documentation

## 2021-01-25 DIAGNOSIS — Z20822 Contact with and (suspected) exposure to covid-19: Secondary | ICD-10-CM | POA: Diagnosis not present

## 2021-01-25 DIAGNOSIS — Z8616 Personal history of COVID-19: Secondary | ICD-10-CM | POA: Diagnosis not present

## 2021-01-25 DIAGNOSIS — R509 Fever, unspecified: Secondary | ICD-10-CM | POA: Insufficient documentation

## 2021-01-25 LAB — CBC WITH DIFFERENTIAL/PLATELET
Abs Immature Granulocytes: 0.03 10*3/uL (ref 0.00–0.07)
Basophils Absolute: 0 10*3/uL (ref 0.0–0.1)
Basophils Relative: 0 %
Eosinophils Absolute: 0 10*3/uL (ref 0.0–0.5)
Eosinophils Relative: 0 %
HCT: 42.6 % (ref 39.0–52.0)
Hemoglobin: 14.3 g/dL (ref 13.0–17.0)
Immature Granulocytes: 0 %
Lymphocytes Relative: 8 %
Lymphs Abs: 1 10*3/uL (ref 0.7–4.0)
MCH: 29.5 pg (ref 26.0–34.0)
MCHC: 33.6 g/dL (ref 30.0–36.0)
MCV: 87.8 fL (ref 80.0–100.0)
Monocytes Absolute: 1.1 10*3/uL — ABNORMAL HIGH (ref 0.1–1.0)
Monocytes Relative: 10 %
Neutro Abs: 9.3 10*3/uL — ABNORMAL HIGH (ref 1.7–7.7)
Neutrophils Relative %: 82 %
Platelets: 146 10*3/uL — ABNORMAL LOW (ref 150–400)
RBC: 4.85 MIL/uL (ref 4.22–5.81)
RDW: 13.4 % (ref 11.5–15.5)
WBC: 11.4 10*3/uL — ABNORMAL HIGH (ref 4.0–10.5)
nRBC: 0 % (ref 0.0–0.2)

## 2021-01-25 LAB — COMPREHENSIVE METABOLIC PANEL
ALT: 13 U/L (ref 0–44)
AST: 12 U/L — ABNORMAL LOW (ref 15–41)
Albumin: 4.5 g/dL (ref 3.5–5.0)
Alkaline Phosphatase: 54 U/L (ref 38–126)
Anion gap: 9 (ref 5–15)
BUN: 11 mg/dL (ref 6–20)
CO2: 27 mmol/L (ref 22–32)
Calcium: 9.4 mg/dL (ref 8.9–10.3)
Chloride: 101 mmol/L (ref 98–111)
Creatinine, Ser: 0.98 mg/dL (ref 0.61–1.24)
GFR, Estimated: >60 mL/min (ref >60)
Glucose, Bld: 96 mg/dL (ref 70–99)
Potassium: 3.6 mmol/L (ref 3.5–5.1)
Sodium: 137 mmol/L (ref 135–145)
Total Bilirubin: 0.6 mg/dL (ref 0.3–1.2)
Total Protein: 7.1 g/dL (ref 6.5–8.1)

## 2021-01-25 LAB — RESP PANEL BY RT-PCR (FLU A&B, COVID) ARPGX2
Influenza A by PCR: NEGATIVE
Influenza B by PCR: NEGATIVE
SARS Coronavirus 2 by RT PCR: NEGATIVE

## 2021-01-25 LAB — MONONUCLEOSIS SCREEN: Mono Screen: NEGATIVE

## 2021-01-25 LAB — URINALYSIS, ROUTINE W REFLEX MICROSCOPIC
Bilirubin Urine: NEGATIVE
Glucose, UA: NEGATIVE mg/dL
Hgb urine dipstick: NEGATIVE
Leukocytes,Ua: NEGATIVE
Nitrite: NEGATIVE
Protein, ur: 30 mg/dL — AB
Specific Gravity, Urine: 1.031 — ABNORMAL HIGH (ref 1.005–1.030)
pH: 6 (ref 5.0–8.0)

## 2021-01-25 LAB — GROUP A STREP BY PCR: Group A Strep by PCR: NOT DETECTED

## 2021-01-25 MED ORDER — SODIUM CHLORIDE 0.9 % IV BOLUS
1000.0000 mL | Freq: Once | INTRAVENOUS | Status: AC
  Administered 2021-01-25: 1000 mL via INTRAVENOUS

## 2021-01-25 MED ORDER — IBUPROFEN 400 MG PO TABS
600.0000 mg | ORAL_TABLET | Freq: Once | ORAL | Status: AC
  Administered 2021-01-25: 600 mg via ORAL
  Filled 2021-01-25: qty 1

## 2021-01-25 MED ORDER — ACETAMINOPHEN 325 MG PO TABS
650.0000 mg | ORAL_TABLET | Freq: Once | ORAL | Status: AC | PRN
  Administered 2021-01-25: 650 mg via ORAL
  Filled 2021-01-25: qty 2
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
## 2021-01-25 NOTE — ED Triage Notes (Signed)
Fever ear ach and feeling bad since last  Night not peeing  He states

## 2021-01-28 LAB — CMV IGM: CMV IgM: <30 AU/mL (ref 0.0–29.9)

## 2021-01-29 ENCOUNTER — Encounter: Payer: Self-pay | Admitting: Family Medicine

## 2021-01-29 LAB — EPSTEIN-BARR VIRUS VCA, IGG: EBV VCA IgG: 299 U/mL — ABNORMAL HIGH (ref 0.0–17.9)

## 2021-01-29 LAB — EPSTEIN-BARR VIRUS VCA, IGM: EBV VCA IgM: <36 U/mL (ref 0.0–35.9)

## 2021-01-29 LAB — RSV(RESPIRATORY SYNCYTIAL VIRUS) AB, BLOOD: RSV Ab: NEGATIVE

## 2021-02-14 ENCOUNTER — Encounter: Payer: Self-pay | Admitting: Family Medicine

## 2021-02-14 ENCOUNTER — Other Ambulatory Visit: Payer: Self-pay

## 2021-02-14 ENCOUNTER — Ambulatory Visit (INDEPENDENT_AMBULATORY_CARE_PROVIDER_SITE_OTHER): Payer: 59 | Admitting: Family Medicine

## 2021-02-14 VITALS — BP 116/74 | HR 68 | Temp 97.8°F | Ht 72.0 in | Wt 181.2 lb

## 2021-02-14 DIAGNOSIS — Z0000 Encounter for general adult medical examination without abnormal findings: Secondary | ICD-10-CM | POA: Diagnosis not present

## 2021-02-14 LAB — COMPREHENSIVE METABOLIC PANEL
ALT: 21 U/L (ref 0–53)
AST: 14 U/L (ref 0–37)
Albumin: 4.6 g/dL (ref 3.5–5.2)
Alkaline Phosphatase: 54 U/L (ref 39–117)
BUN: 15 mg/dL (ref 6–23)
CO2: 30 mEq/L (ref 19–32)
Calcium: 9.6 mg/dL (ref 8.4–10.5)
Chloride: 105 mEq/L (ref 96–112)
Creatinine, Ser: 0.95 mg/dL (ref 0.40–1.50)
GFR: 99.08 mL/min (ref >60.00)
Glucose, Bld: 81 mg/dL (ref 70–99)
Potassium: 4.3 mEq/L (ref 3.5–5.1)
Sodium: 141 mEq/L (ref 135–145)
Total Bilirubin: 0.6 mg/dL (ref 0.2–1.2)
Total Protein: 6.7 g/dL (ref 6.0–8.3)

## 2021-02-14 LAB — LIPID PANEL
Cholesterol: 189 mg/dL (ref 0–200)
HDL: 52.1 mg/dL (ref >39.00)
LDL Cholesterol: 125 mg/dL — ABNORMAL HIGH (ref 0–99)
NonHDL: 136.98
Total CHOL/HDL Ratio: 4
Triglycerides: 59 mg/dL (ref 0.0–149.0)
VLDL: 11.8 mg/dL (ref 0.0–40.0)

## 2021-02-14 LAB — URINALYSIS, ROUTINE W REFLEX MICROSCOPIC
Bilirubin Urine: NEGATIVE
Hgb urine dipstick: NEGATIVE
Ketones, ur: NEGATIVE
Leukocytes,Ua: NEGATIVE
Nitrite: NEGATIVE
RBC / HPF: NONE SEEN (ref 0–?)
Specific Gravity, Urine: 1.015 (ref 1.000–1.030)
Total Protein, Urine: NEGATIVE
Urine Glucose: NEGATIVE
Urobilinogen, UA: 0.2 (ref 0.0–1.0)
WBC, UA: NONE SEEN (ref 0–?)
pH: 7.5 (ref 5.0–8.0)

## 2021-02-14 LAB — CBC
HCT: 42.7 % (ref 39.0–52.0)
Hemoglobin: 14.4 g/dL (ref 13.0–17.0)
MCHC: 33.7 g/dL (ref 30.0–36.0)
MCV: 87 fl (ref 78.0–100.0)
Platelets: 190 10*3/uL (ref 150.0–400.0)
RBC: 4.91 Mil/uL (ref 4.22–5.81)
RDW: 14.3 % (ref 11.5–15.5)
WBC: 5 10*3/uL (ref 4.0–10.5)
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
## 2021-02-18 ENCOUNTER — Telehealth: Payer: Self-pay

## 2021-02-18 DIAGNOSIS — Z3009 Encounter for other general counseling and advice on contraception: Secondary | ICD-10-CM

## 2021-02-18 NOTE — Telephone Encounter (Signed)
Patient calling for a referral to have vasectomy. Latst OV with PCP 08/08/20. Please advise

## 2021-02-21 NOTE — Telephone Encounter (Signed)
Patient aware that referral placed

## 2021-02-21 NOTE — Telephone Encounter (Signed)
Referral placed.

## 2021-04-04 ENCOUNTER — Encounter: Payer: Self-pay | Admitting: Family Medicine

## 2021-04-04 NOTE — Telephone Encounter (Signed)
Anything on the status with message below?

## 2021-09-06 ENCOUNTER — Ambulatory Visit (INDEPENDENT_AMBULATORY_CARE_PROVIDER_SITE_OTHER): Payer: 59 | Admitting: Nurse Practitioner

## 2021-09-06 ENCOUNTER — Other Ambulatory Visit: Payer: Self-pay

## 2021-09-06 VITALS — BP 132/90 | HR 76 | Temp 97.6°F | Resp 10 | Ht 72.0 in | Wt 184.4 lb

## 2021-09-06 DIAGNOSIS — R21 Rash and other nonspecific skin eruption: Secondary | ICD-10-CM | POA: Diagnosis not present

## 2021-09-06 MED ORDER — METHYLPREDNISOLONE ACETATE 80 MG/ML IJ SUSP
80.0000 mg | Freq: Once | INTRAMUSCULAR | Status: AC
  Administered 2021-09-06: 80 mg via INTRAMUSCULAR

## 2021-09-06 NOTE — Assessment & Plan Note (Signed)
Unsure of rash etiology.  Did discuss this with patient.  Differential could be scabies but not typical presentation.  No burrowing or furrowing in the skin around the nails or webs of fingers.  Did state hotel but rash was present prior.  No one else in the house has the rash.  We will treat him with Depo-Medrol 80 mg IM one-time dose in office.  Report if he does not have improvement.  Follow-up
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
## 2021-09-06 NOTE — Patient Instructions (Signed)
Nice to see you today We gave you an injection of steroids in office today If the rash does not go away let me know.

## 2022-05-06 ENCOUNTER — Encounter: Payer: Self-pay | Admitting: Family Medicine

## 2022-05-06 ENCOUNTER — Ambulatory Visit (INDEPENDENT_AMBULATORY_CARE_PROVIDER_SITE_OTHER): Payer: BC Managed Care – PPO | Admitting: Family Medicine

## 2022-05-06 VITALS — BP 130/81 | HR 73 | Temp 97.6°F | Ht 72.0 in | Wt 183.5 lb

## 2022-05-06 DIAGNOSIS — Z0000 Encounter for general adult medical examination without abnormal findings: Secondary | ICD-10-CM | POA: Diagnosis not present

## 2022-05-06 DIAGNOSIS — M5431 Sciatica, right side: Secondary | ICD-10-CM | POA: Diagnosis not present

## 2022-05-06 LAB — URINALYSIS, ROUTINE W REFLEX MICROSCOPIC
Bilirubin Urine: NEGATIVE
Hgb urine dipstick: NEGATIVE
Ketones, ur: NEGATIVE
Leukocytes,Ua: NEGATIVE
Nitrite: NEGATIVE
RBC / HPF: NONE SEEN (ref 0–?)
Specific Gravity, Urine: 1.01 (ref 1.000–1.030)
Total Protein, Urine: NEGATIVE
Urine Glucose: NEGATIVE
Urobilinogen, UA: 0.2 (ref 0.0–1.0)
WBC, UA: NONE SEEN (ref 0–?)
pH: 7.5 (ref 5.0–8.0)

## 2022-05-06 LAB — CBC
HCT: 41.8 % (ref 39.0–52.0)
Hemoglobin: 14.2 g/dL (ref 13.0–17.0)
MCHC: 34 g/dL (ref 30.0–36.0)
MCV: 87.8 fl (ref 78.0–100.0)
Platelets: 170 10*3/uL (ref 150.0–400.0)
RBC: 4.75 Mil/uL (ref 4.22–5.81)
RDW: 13.5 % (ref 11.5–15.5)
WBC: 5.7 10*3/uL (ref 4.0–10.5)

## 2022-05-06 LAB — COMPREHENSIVE METABOLIC PANEL
ALT: 20 U/L (ref 0–53)
AST: 15 U/L (ref 0–37)
Albumin: 4.4 g/dL (ref 3.5–5.2)
Alkaline Phosphatase: 56 U/L (ref 39–117)
BUN: 15 mg/dL (ref 6–23)
CO2: 30 mEq/L (ref 19–32)
Calcium: 9.6 mg/dL (ref 8.4–10.5)
Chloride: 104 mEq/L (ref 96–112)
Creatinine, Ser: 0.96 mg/dL (ref 0.40–1.50)
GFR: 97.01 mL/min (ref >60.00)
Glucose, Bld: 79 mg/dL (ref 70–99)
Potassium: 4 mEq/L (ref 3.5–5.1)
Sodium: 141 mEq/L (ref 135–145)
Total Bilirubin: 0.8 mg/dL (ref 0.2–1.2)
Total Protein: 6.8 g/dL (ref 6.0–8.3)

## 2022-05-06 LAB — LIPID PANEL
Cholesterol: 162 mg/dL (ref 0–200)
HDL: 52.9 mg/dL (ref >39.00)
LDL Cholesterol: 96 mg/dL (ref 0–99)
NonHDL: 108.82
Total CHOL/HDL Ratio: 3
Triglycerides: 64 mg/dL (ref 0.0–149.0)
VLDL: 12.8 mg/dL (ref 0.0–40.0)

## 2022-05-06 NOTE — Progress Notes (Signed)
Established Patient Office Visit  Subjective   Patient ID: William Baldwin, male    DOB: Jun 17, 1981  Age: 41 y.o. MRN: [id number]  Chief Complaint  Patient presents with   Annual Exam    CPE c/o lower back pain x 4 mth requesting a referral. Not fasting.    HPI for yearly physical.  Doing well.  Reports back pain that radiates through his right buttock down through the backside of his leg to his knee.  There is no numbness tingling.  Denies weakness.  There is no change in bowel or bladder function.  He cannot recall a specific injury.  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  He lives at home with his wife and 2 year old daughter who has Rett syndrome.  She is 73 pounds and he frequently has to lift her.  He is seeing the dentist regularly.  No regular exercise outside of work.    Review of Systems  Constitutional: Negative.   HENT: Negative.    Eyes:  Negative for blurred vision, discharge and redness.  Respiratory: Negative.    Cardiovascular: Negative.   Gastrointestinal:  Negative for abdominal pain.  Genitourinary: Negative.   Musculoskeletal:  Positive for back pain. Negative for myalgias.  Skin:  Negative for rash.  Neurological:  Negative for tingling, loss of consciousness and weakness.  Endo/Heme/Allergies:  Negative for polydipsia.      Objective:     BP 130/81 (BP Location: Left Arm)   Pulse 73   Temp 97.6 F (36.4 C) (Temporal)   Ht 6' (1.829 m)   Wt 183 lb 8 oz (83.2 kg)   SpO2 98%   BMI 24.89 kg/m    Physical Exam Constitutional:      General: He is not in acute distress.    Appearance: Normal appearance. He is not ill-appearing, toxic-appearing or diaphoretic.  HENT:     Head: Normocephalic and atraumatic.     Right Ear: External ear normal.     Left Ear: External ear normal.     Mouth/Throat:     Mouth: Mucous membranes are moist.     Pharynx: Oropharynx is clear. No oropharyngeal exudate or posterior oropharyngeal erythema.  Eyes:      General: No scleral icterus.       Right eye: No discharge.        Left eye: No discharge.     Extraocular Movements: Extraocular movements intact.     Conjunctiva/sclera: Conjunctivae normal.     Pupils: Pupils are equal, round, and reactive to light.  Cardiovascular:     Rate and Rhythm: Normal rate and regular rhythm.  Pulmonary:     Effort: Pulmonary effort is normal. No respiratory distress.     Breath sounds: Normal breath sounds.  Abdominal:     General: Bowel sounds are normal.     Tenderness: There is no abdominal tenderness. There is no guarding.  Musculoskeletal:     Cervical back: No rigidity or tenderness.     Lumbar back: No tenderness or bony tenderness. Normal range of motion. Negative right straight leg raise test and negative left straight leg raise test.  Skin:    General: Skin is warm and dry.  Neurological:     Mental Status: He is alert and oriented to person, place, and time.     Motor: No weakness or atrophy.  Psychiatric:        Mood and Affect: Mood normal.        Behavior: Behavior normal.  No results found for any visits on 05/06/22.    The 10-year ASCVD risk score (Arnett DK, et al., 2019) is: 1.4%    Assessment & Plan:   Problem List Items Addressed This Visit       Nervous and Auditory   Sciatica of right side   Relevant Orders   Ambulatory referral to Orthopedic Surgery     Other   Healthcare maintenance - Primary   Relevant Orders   CBC   Comprehensive metabolic panel   Lipid panel   Urinalysis, Routine w reflex microscopic    Return in about 1 year (around 05/07/2023), or if symptoms worsen or fail to improve.  Information was given on sciatica and exercises to do.  We discussed sciatic nerve stretches.  Encouraged regular physical exercise.  Walking would be fine.  Referral to orthopedics management.  Advised flu and COVID vaccines.  Libby Maw, MD

## 2022-08-07 IMAGING — CT CT ANGIO CHEST
3 of 9 series · 18 of 36 positions shown · IV contrast (Omnipaque)
Comparison: Chest x-ray from earlier in the same day.

CLINICAL DATA: History of OHXEG-KN pneumonia with increasing
left-sided chest pain, initial encounter

EXAM:
CT ANGIOGRAPHY CHEST WITH CONTRAST
TECHNIQUE: Multidetector CT imaging of the chest was performed using the
standard protocol during bolus administration of intravenous
contrast. Multiplanar CT image reconstructions and MIPs were
obtained to evaluate the vascular anatomy.
CONTRAST:  100mL OMNIPAQUE IOHEXOL 350 MG/ML SOLN

[Series 5: pe thins · axial · 0.86mm/px · z∈[+794,+1029]mm · 14 of 273 slices shown]
[im 19/273  lung]
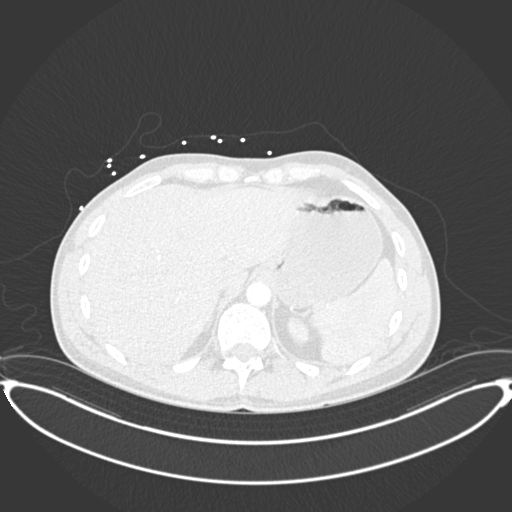
[im 37/273  mediastinal]
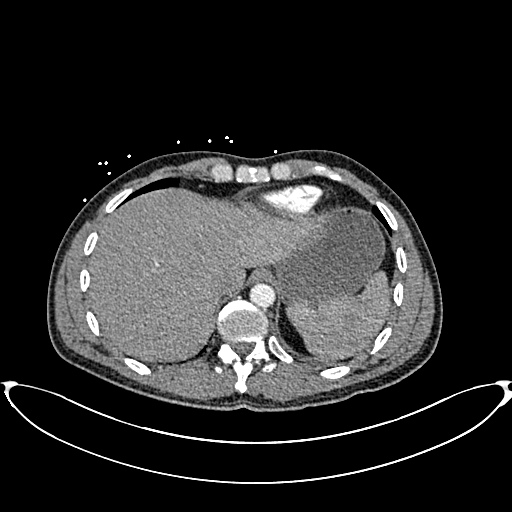
[im 55/273  lung]
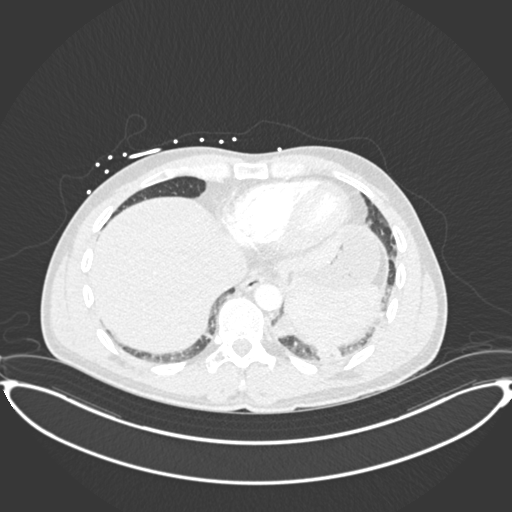
[im 73/273  mediastinal]
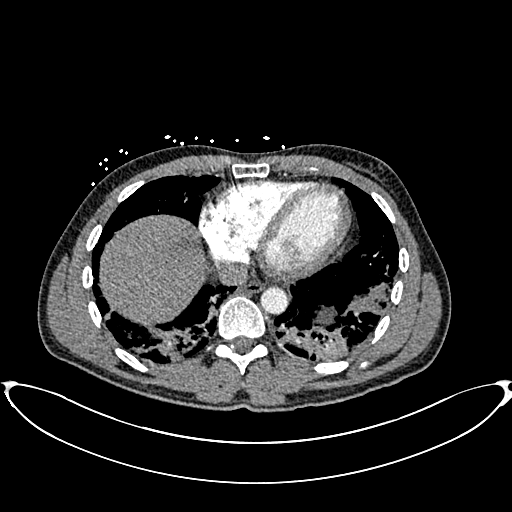
[im 91/273  lung]
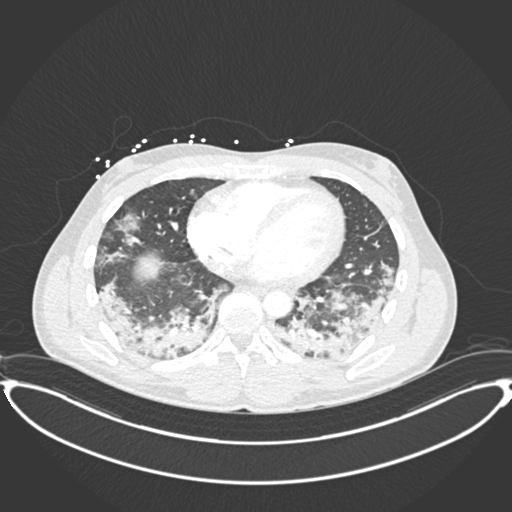
[im 109/273  mediastinal]
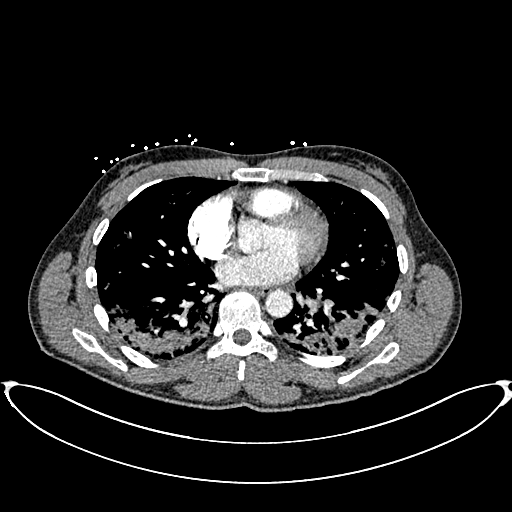
[im 127/273  lung]
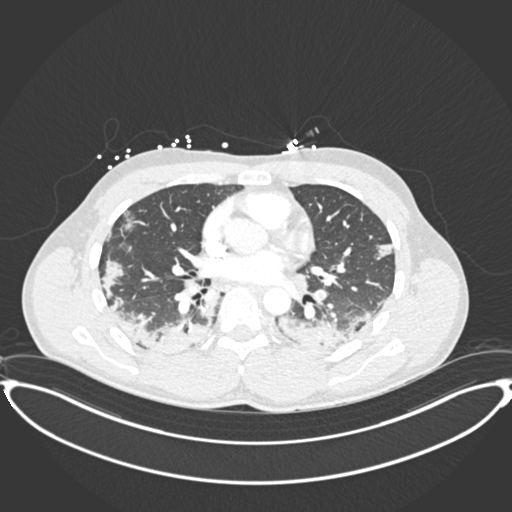
[im 146/273  mediastinal]
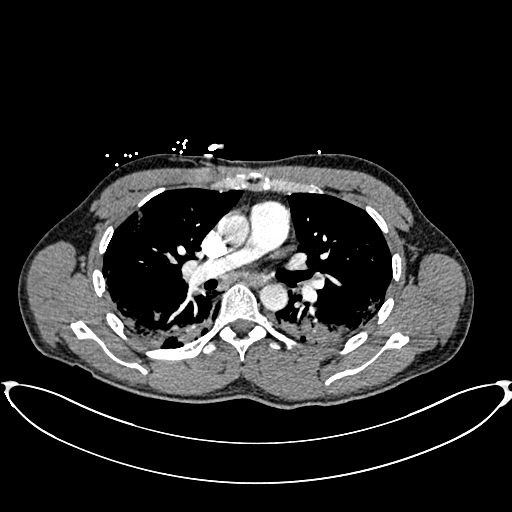
[im 164/273  lung]
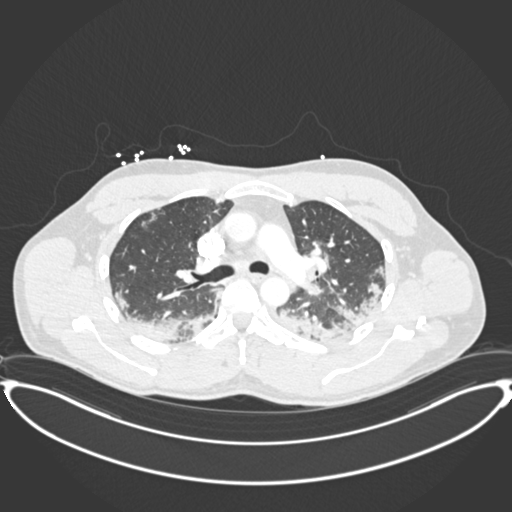
[im 182/273  mediastinal]
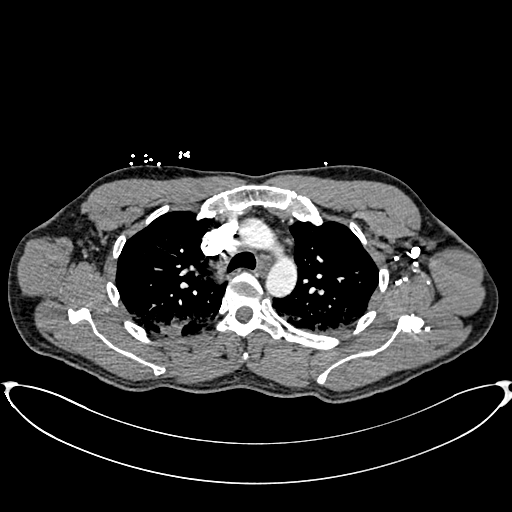
[im 200/273  lung]
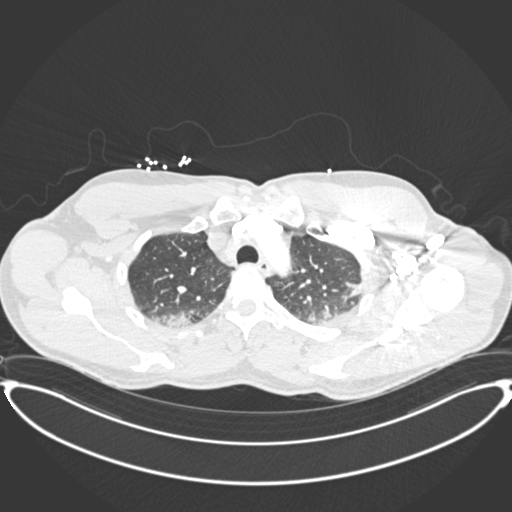
[im 218/273  mediastinal]
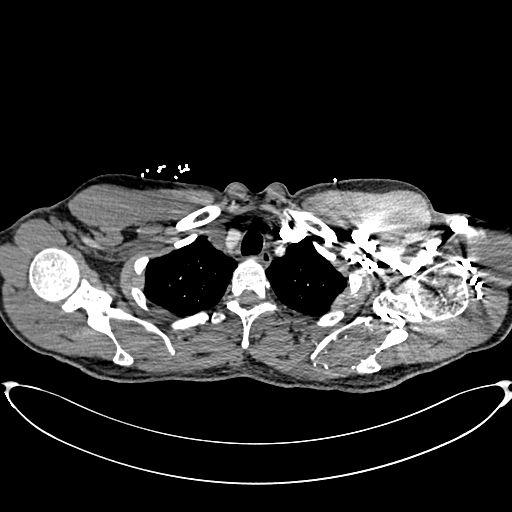
[im 236/273  lung]
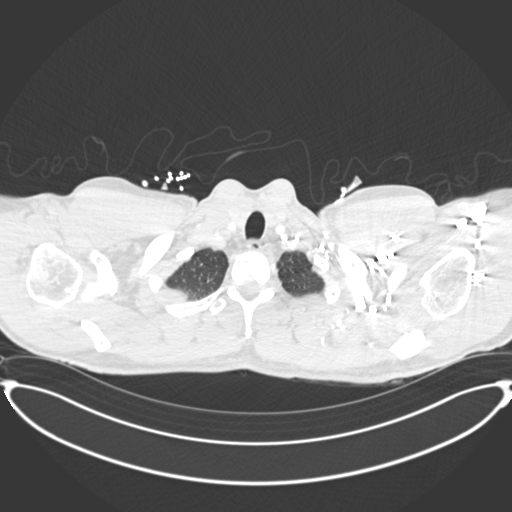
[im 254/273  mediastinal]
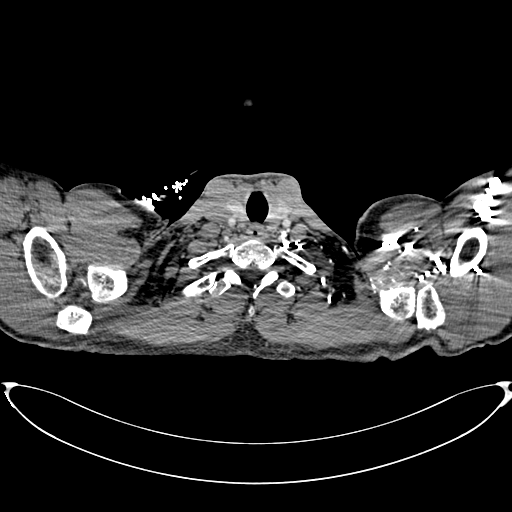

[Series 6: pe lung · axial · 0.98mm/px · z∈[+874,+988]mm · 3 of 78 slices shown]
[im 20/78  mediastinal]
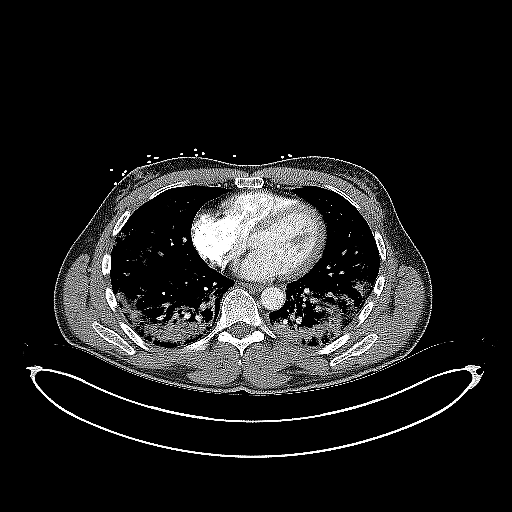
[im 39/78  mediastinal]
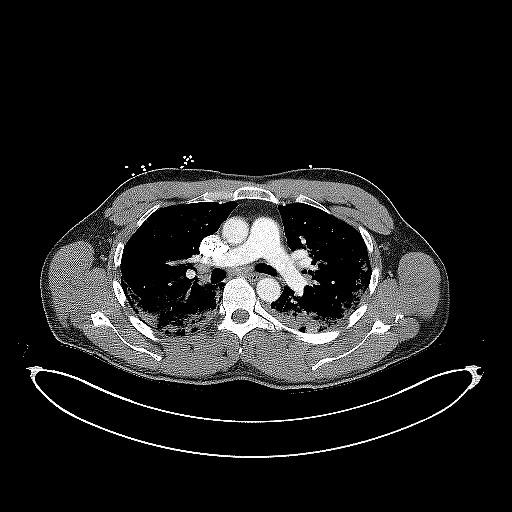
[im 58/78  mediastinal]
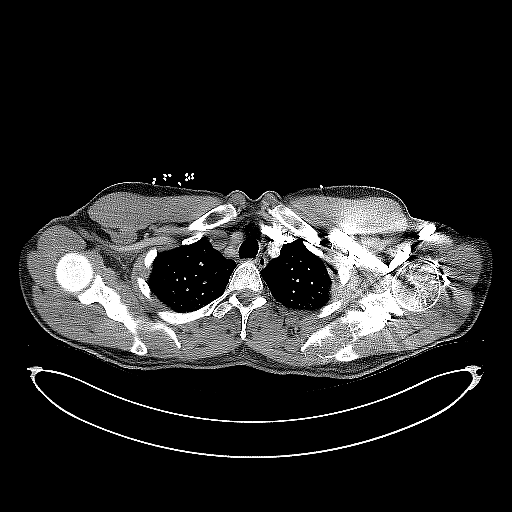

[Series 7: pe coronal mpr · coronal · 0.53mm/px · 1 of 119 slices shown]
[im 60/119  mediastinal]
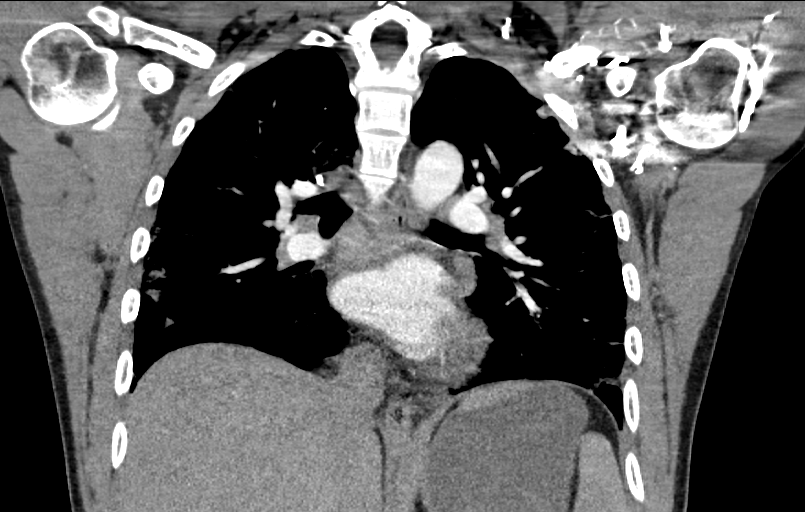

[18 of 36 positions shown; findings below may reference images not displayed]

FINDINGS: Cardiovascular: Thoracic aorta and its branches are within normal
limits. The pulmonary artery shows a normal branching pattern. No
definitive emboli are noted on the right. In the left lower lobe
pulmonary artery there is branching filling defect identified
consistent with acute pulmonary emboli. No right heart strain is
noted.

Mediastinum/Nodes: Thoracic inlet is within normal limits. Scattered
small mediastinal lymph nodes are noted likely reactive in nature.
The esophagus as visualized is within normal limits.

Lungs/Pleura: Lungs are well aerated bilaterally. Patchy airspace
opacities are noted consistent with the given clinical history of
OHXEG-KN pneumonia. More focal bilateral lower lobe infiltrate is
seen.

Upper Abdomen: No acute abnormality.

Musculoskeletal: No acute bony abnormality is noted.

Review of the MIP images confirms the above findings.
IMPRESSION: New acute left lower lobe pulmonary emboli. This is likely the
etiology of the patient's known left-sided pleuritic chest pain.

Diffuse airspace opacities primarily in the lower lobes bilaterally
consistent with the given clinical history of OHXEG-KN.

## 2023-11-12 DIAGNOSIS — L814 Other melanin hyperpigmentation: Secondary | ICD-10-CM | POA: Diagnosis not present

## 2023-11-12 DIAGNOSIS — L578 Other skin changes due to chronic exposure to nonionizing radiation: Secondary | ICD-10-CM | POA: Diagnosis not present

## 2023-11-12 DIAGNOSIS — D485 Neoplasm of uncertain behavior of skin: Secondary | ICD-10-CM | POA: Diagnosis not present

## 2023-11-12 DIAGNOSIS — D225 Melanocytic nevi of trunk: Secondary | ICD-10-CM | POA: Diagnosis not present

## 2024-01-06 DIAGNOSIS — E039 Hypothyroidism, unspecified: Secondary | ICD-10-CM | POA: Diagnosis not present

## 2024-01-06 DIAGNOSIS — E7211 Homocystinuria: Secondary | ICD-10-CM | POA: Diagnosis not present

## 2024-01-06 DIAGNOSIS — R739 Hyperglycemia, unspecified: Secondary | ICD-10-CM | POA: Diagnosis not present

## 2024-01-06 DIAGNOSIS — Z13220 Encounter for screening for lipoid disorders: Secondary | ICD-10-CM | POA: Diagnosis not present

## 2024-01-06 DIAGNOSIS — E559 Vitamin D deficiency, unspecified: Secondary | ICD-10-CM | POA: Diagnosis not present

## 2024-01-06 DIAGNOSIS — M810 Age-related osteoporosis without current pathological fracture: Secondary | ICD-10-CM | POA: Diagnosis not present

## 2024-03-08 ENCOUNTER — Encounter: Payer: Self-pay | Admitting: Emergency Medicine

## 2024-03-08 ENCOUNTER — Ambulatory Visit: Admission: EM | Admit: 2024-03-08 | Discharge: 2024-03-08 | Disposition: A | Discharge status: Home / Self Care

## 2024-03-08 DIAGNOSIS — S0101XA Laceration without foreign body of scalp, initial encounter: Secondary | ICD-10-CM | POA: Diagnosis not present

## 2024-03-08 MED ORDER — BACITRACIN ZINC 500 UNIT/GM EX OINT
TOPICAL_OINTMENT | Freq: Once | CUTANEOUS | Status: AC
  Administered 2024-03-08: 1 via TOPICAL

## 2024-03-08 NOTE — ED Triage Notes (Signed)
 Pt presents with forehead laceration after he hit head on wood board while at work.

## 2024-03-08 NOTE — Discharge Instructions (Addendum)
  1. Scalp laceration, initial encounter (Primary) - bacitracin  ointment applied to wound following laceration repair with staples - Wound care (Clean Wound) wound care performed prior to laceration repair with iodine and saline - Apply dressing over wound prior to laceration repair with lidocaine  with epinephrine for localized anesthesia. - Laceration Repair completed using skin stapler by provider, 7 staples placed with good approximation, no secondary sign of infection, bleeding well-controlled - Return to urgent care in 7 to 10 days for suture/staple removal
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
## 2024-03-15 ENCOUNTER — Ambulatory Visit
Admission: EM | Admit: 2024-03-15 | Discharge: 2024-03-15 | Disposition: A | Discharge status: Home / Self Care | Attending: Physician Assistant | Admitting: Physician Assistant

## 2024-03-15 ENCOUNTER — Other Ambulatory Visit: Payer: Self-pay

## 2024-03-15 DIAGNOSIS — Z4802 Encounter for removal of sutures: Secondary | ICD-10-CM | POA: Diagnosis not present

## 2024-03-15 NOTE — ED Triage Notes (Signed)
 Pt presents to urgent care for staple removal. Pt was last seen here on 7/15. Per Dorn NP note, 7 sutures were placed to laceration on scalp. On RN assessment, 8 sutures are present. Pt denies any pain and does not have any concerns.

## 2024-04-19 DIAGNOSIS — E7211 Homocystinuria: Secondary | ICD-10-CM | POA: Diagnosis not present

## 2024-04-19 DIAGNOSIS — M859 Disorder of bone density and structure, unspecified: Secondary | ICD-10-CM | POA: Diagnosis not present

## 2024-04-19 DIAGNOSIS — E559 Vitamin D deficiency, unspecified: Secondary | ICD-10-CM | POA: Diagnosis not present

## 2024-04-19 DIAGNOSIS — E291 Testicular hypofunction: Secondary | ICD-10-CM | POA: Diagnosis not present
